# Patient Record
Sex: Female | Born: 1935 | Race: Black or African American | Hispanic: No | Smoking: Never smoker
Health system: Southern US, Community
[De-identification: ages and names within clinical notes are randomized; demographics above are authoritative.]

## PROBLEM LIST (undated history)

## (undated) DIAGNOSIS — M1990 Unspecified osteoarthritis, unspecified site: Secondary | ICD-10-CM

## (undated) DIAGNOSIS — K219 Gastro-esophageal reflux disease without esophagitis: Secondary | ICD-10-CM

## (undated) DIAGNOSIS — I10 Essential (primary) hypertension: Secondary | ICD-10-CM

## (undated) DIAGNOSIS — G4730 Sleep apnea, unspecified: Secondary | ICD-10-CM

## (undated) DIAGNOSIS — I200 Unstable angina: Secondary | ICD-10-CM

## (undated) DIAGNOSIS — G4733 Obstructive sleep apnea (adult) (pediatric): Secondary | ICD-10-CM

## (undated) DIAGNOSIS — Z9989 Dependence on other enabling machines and devices: Secondary | ICD-10-CM

## (undated) DIAGNOSIS — E119 Type 2 diabetes mellitus without complications: Secondary | ICD-10-CM

## (undated) DIAGNOSIS — R3915 Urgency of urination: Secondary | ICD-10-CM

## (undated) HISTORY — PX: ABDOMINAL HYSTERECTOMY: SHX81

## (undated) HISTORY — DX: Dependence on other enabling machines and devices: Z99.89

## (undated) HISTORY — DX: Obstructive sleep apnea (adult) (pediatric): G47.33

## (undated) HISTORY — DX: Unstable angina: I20.0

## (undated) HISTORY — PX: APPENDECTOMY: SHX54

## (undated) HISTORY — PX: EYE SURGERY: SHX253

---

## 2013-03-25 DIAGNOSIS — I10 Essential (primary) hypertension: Secondary | ICD-10-CM | POA: Insufficient documentation

## 2015-04-07 NOTE — Progress Notes (Signed)
Please put orders in Epic surgery 04-24-15 pre op 04-17-15 Thanks

## 2015-04-10 ENCOUNTER — Ambulatory Visit: Payer: Self-pay | Admitting: Orthopedic Surgery

## 2015-04-10 NOTE — Progress Notes (Signed)
Preoperative surgical orders have been place into the Epic hospital system for Tina Long on 04/10/2015, 12:51 PM  by Patrica DuelPERKINS, ALEXZANDREW for surgery on 04-24-2015.  Preop Total Knee orders including Experal, IV Tylenol, and IV Decadron as long as there are no contraindications to the above medications. [name], PA-C

## 2015-04-13 ENCOUNTER — Other Ambulatory Visit (HOSPITAL_COMMUNITY): Payer: Self-pay | Admitting: Anesthesiology

## 2015-04-13 NOTE — Progress Notes (Addendum)
02/09/2015-Pre-operative clearance from Dr. Lucienne MinksKadir on chart with last office visit and labs (Hemoglobin A1C,CMP,CBC with diff.,,U/A dip .01/29/2013-[facility]. 01/18/2013-[facility].

## 2015-04-17 ENCOUNTER — Encounter (HOSPITAL_COMMUNITY): Payer: Self-pay

## 2015-04-17 ENCOUNTER — Other Ambulatory Visit: Payer: Self-pay

## 2015-04-17 ENCOUNTER — Encounter (HOSPITAL_COMMUNITY)
Admission: RE | Admit: 2015-04-17 | Discharge: 2015-04-17 | Disposition: A | Discharge status: Home / Self Care | Payer: Medicare Other | Source: Ambulatory Visit | Attending: Orthopedic Surgery | Admitting: Orthopedic Surgery

## 2015-04-17 DIAGNOSIS — Z01812 Encounter for preprocedural laboratory examination: Secondary | ICD-10-CM | POA: Insufficient documentation

## 2015-04-17 DIAGNOSIS — M1711 Unilateral primary osteoarthritis, right knee: Secondary | ICD-10-CM | POA: Insufficient documentation

## 2015-04-17 HISTORY — DX: Unspecified osteoarthritis, unspecified site: M19.90

## 2015-04-17 HISTORY — DX: Essential (primary) hypertension: I10

## 2015-04-17 HISTORY — DX: Gastro-esophageal reflux disease without esophagitis: K21.9

## 2015-04-17 LAB — URINALYSIS, ROUTINE W REFLEX MICROSCOPIC
BILIRUBIN URINE: NEGATIVE
Glucose, UA: NEGATIVE mg/dL
KETONES UR: NEGATIVE mg/dL
Leukocytes, UA: NEGATIVE
Nitrite: NEGATIVE
Protein, ur: NEGATIVE mg/dL
SPECIFIC GRAVITY, URINE: 1.02 (ref 1.005–1.030)
UROBILINOGEN UA: 1 mg/dL (ref 0.0–1.0)
pH: 6.5 (ref 5.0–8.0)

## 2015-04-17 LAB — CBC
HEMATOCRIT: 37.6 % (ref 36.0–46.0)
HEMOGLOBIN: 11.5 g/dL — AB (ref 12.0–15.0)
MCH: 26.8 pg (ref 26.0–34.0)
MCHC: 30.6 g/dL (ref 30.0–36.0)
MCV: 87.6 fL (ref 78.0–100.0)
Platelets: 265 10*3/uL (ref 150–400)
RBC: 4.29 MIL/uL (ref 3.87–5.11)
RDW: 13.8 % (ref 11.5–15.5)
WBC: 7.7 10*3/uL (ref 4.0–10.5)

## 2015-04-17 LAB — COMPREHENSIVE METABOLIC PANEL
ALT: 15 U/L (ref 14–54)
AST: 18 U/L (ref 15–41)
Albumin: 4 g/dL (ref 3.5–5.0)
Alkaline Phosphatase: 56 U/L (ref 38–126)
Anion gap: 8 (ref 5–15)
BILIRUBIN TOTAL: 0.5 mg/dL (ref 0.3–1.2)
BUN: 30 mg/dL — ABNORMAL HIGH (ref 6–20)
CHLORIDE: 108 mmol/L (ref 101–111)
CO2: 27 mmol/L (ref 22–32)
Calcium: 9.7 mg/dL (ref 8.9–10.3)
Creatinine, Ser: 1.47 mg/dL — ABNORMAL HIGH (ref 0.44–1.00)
GFR calc Af Amer: 37 mL/min — ABNORMAL LOW (ref >60)
GFR, EST NON AFRICAN AMERICAN: 32 mL/min — AB (ref >60)
Glucose, Bld: 125 mg/dL — ABNORMAL HIGH (ref 65–99)
Potassium: 5.1 mmol/L (ref 3.5–5.1)
Sodium: 143 mmol/L (ref 135–145)
Total Protein: 7.7 g/dL (ref 6.5–8.1)

## 2015-04-17 LAB — PROTIME-INR
INR: 0.97 (ref 0.00–1.49)
Prothrombin Time: 13 seconds (ref 11.6–15.2)

## 2015-04-17 LAB — APTT: aPTT: 34 seconds (ref 24–37)

## 2015-04-17 LAB — ABO/RH: ABO/RH(D): O POS

## 2015-04-17 LAB — SURGICAL PCR SCREEN
MRSA, PCR: NEGATIVE
STAPHYLOCOCCUS AUREUS: NEGATIVE

## 2015-04-17 LAB — URINE MICROSCOPIC-ADD ON

## 2015-04-17 NOTE — Patient Instructions (Addendum)
Tina Long  04/17/2015   Your procedure is scheduled on:   Report to [facility] Main  Entrance and follow signs to               Short Stay Center at 1045 AM.  Call this number if you have problems the morning of surgery [phone]   Remember: ONLY 1 PERSON MAY GO WITH YOU TO SHORT STAY TO GET  READY MORNING OF YOUR SURGERY.   Do not eat food  :After Midnight. MAY HAVE CLEAR LIQUIDS FROM MIDNIGHT UP UNTIL 0745 AM THEN NOTHING UNTIL AFTER SURGERY!   BRING CPAP MASK AND TUBING WITH YOU MORNING OF SURGERY!   Take these medicines the morning of surgery with A SIP OF WATER: none                               You may not have any metal on your body including hair pins and              piercings  Do not wear jewelry, make-up, lotions, powders or perfumes, deodorant             Do not wear nail polish.  Do not shave  48 hours prior to surgery.              Men may shave face and neck.   Do not bring valuables to the hospital. [place] IS NOT             RESPONSIBLE   FOR VALUABLES.  Contacts, dentures or bridgework may not be worn into surgery.  Leave suitcase in the car. After surgery it may be brought to your room.     Patients discharged the day of surgery will not be allowed to drive home.  Name and phone number of your driver:  Special Instructions: N/A              Please read over the following fact sheets you were given: _____________________________________________________________________             [facility] - Preparing for Surgery Before surgery, you can play an important role.  Because skin is not sterile, your skin needs to be as free of germs as possible.  You can reduce the number of germs on your skin by washing with CHG (chlorahexidine gluconate) soap before surgery.  CHG is an antiseptic cleaner which kills germs and bonds with the skin to continue killing germs even after washing. Please DO NOT use if you have an allergy to CHG or  antibacterial soaps.  If your skin becomes reddened/irritated stop using the CHG and inform your nurse when you arrive at Short Stay. Do not shave (including legs and underarms) for at least 48 hours prior to the first CHG shower.  You may shave your face/neck. Please follow these instructions carefully:  1.  Shower with CHG Soap the night before surgery and the  morning of Surgery.  2.  If you choose to wash your hair, wash your hair first as usual with your  normal  shampoo.  3.  After you shampoo, rinse your hair and body thoroughly to remove the  shampoo.                           4.  Use CHG  as you would any other liquid soap.  You can apply chg directly  to the skin and wash                       Gently with a scrungie or clean washcloth.  5.  Apply the CHG Soap to your body ONLY FROM THE NECK DOWN.   Do not use on face/ open                           Wound or open sores. Avoid contact with eyes, ears mouth and genitals (private parts).                       Wash face,  Genitals (private parts) with your normal soap.             6.  Wash thoroughly, paying special attention to the area where your surgery  will be performed.  7.  Thoroughly rinse your body with warm water from the neck down.  8.  DO NOT shower/wash with your normal soap after using and rinsing off  the CHG Soap.                9.  Pat yourself dry with a clean towel.            10.  Wear clean pajamas.            11.  Place clean sheets on your bed the night of your first shower and do not  sleep with pets. Day of Surgery : Do not apply any lotions/deodorants the morning of surgery.  Please wear clean clothes to the hospital/surgery center.  FAILURE TO FOLLOW THESE INSTRUCTIONS MAY RESULT IN THE CANCELLATION OF YOUR SURGERY PATIENT SIGNATURE_________________________________  NURSE SIGNATURE__________________________________  ________________________________________________________________________   Rogelia MireIncentive  Spirometer  An incentive spirometer is a tool that can help keep your lungs clear and active. This tool measures how well you are filling your lungs with each breath. Taking long deep breaths may help reverse or decrease the chance of developing breathing (pulmonary) problems (especially infection) following:  A long period of time when you are unable to move or be active. BEFORE THE PROCEDURE   If the spirometer includes an indicator to show your best effort, your nurse or respiratory therapist will set it to a desired goal.  If possible, sit up straight or lean slightly forward. Try not to slouch.  Hold the incentive spirometer in an upright position. INSTRUCTIONS FOR USE   Sit on the edge of your bed if possible, or sit up as far as you can in bed or on a chair.  Hold the incentive spirometer in an upright position.  Breathe out normally.  Place the mouthpiece in your mouth and seal your lips tightly around it.  Breathe in slowly and as deeply as possible, raising the piston or the ball toward the top of the column.  Hold your breath for 3-5 seconds or for as long as possible. Allow the piston or ball to fall to the bottom of the column.  Remove the mouthpiece from your mouth and breathe out normally.  Rest for a few seconds and repeat Steps 1 through 7 at least 10 times every 1-2 hours when you are awake. Take your time and take a few normal breaths between deep breaths.  The spirometer may include an indicator to show your best effort. Use  the indicator as a goal to work toward during each repetition.  After each set of 10 deep breaths, practice coughing to be sure your lungs are clear. If you have an incision (the cut made at the time of surgery), support your incision when coughing by placing a pillow or rolled up towels firmly against it. Once you are able to get out of bed, walk around indoors and cough well. You may stop using the incentive spirometer when instructed by  your caregiver.  RISKS AND COMPLICATIONS  Take your time so you do not get dizzy or light-headed.  If you are in pain, you may need to take or ask for pain medication before doing incentive spirometry. It is harder to take a deep breath if you are having pain. AFTER USE  Rest and breathe slowly and easily.  It can be helpful to keep track of a log of your progress. Your caregiver can provide you with a simple table to help with this. If you are using the spirometer at home, follow these instructions: SEEK MEDICAL CARE IF:   You are having difficultly using the spirometer.  You have trouble using the spirometer as often as instructed.  Your pain medication is not giving enough relief while using the spirometer.  You develop fever of 100.5 F (38.1 C) or higher. SEEK IMMEDIATE MEDICAL CARE IF:   You cough up bloody sputum that had not been present before.  You develop fever of 102 F (38.9 C) or greater.  You develop worsening pain at or near the incision site. MAKE SURE YOU:   Understand these instructions.  Will watch your condition.  Will get help right away if you are not doing well or get worse. Document Released: 03/31/2007 Document Revised: 02/10/2012 Document Reviewed: 06/01/2007 ExitCare Patient Information 2014 ExitCare, [place].   ________________________________________________________________________  WHAT IS A BLOOD TRANSFUSION? Blood Transfusion Information  A transfusion is the replacement of blood or some of its parts. Blood is made up of multiple cells which provide different functions.  Red blood cells carry oxygen and are used for blood loss replacement.  White blood cells fight against infection.  Platelets control bleeding.  Plasma helps clot blood.  Other blood products are available for specialized needs, such as hemophilia or other clotting disorders. BEFORE THE TRANSFUSION  Who gives blood for transfusions?   Healthy volunteers who are  fully evaluated to make sure their blood is safe. This is blood bank blood. Transfusion therapy is the safest it has ever been in the practice of medicine. Before blood is taken from a donor, a complete history is taken to make sure that person has no history of diseases nor engages in risky social behavior (examples are intravenous drug use or sexual activity with multiple partners). The donor's travel history is screened to minimize risk of transmitting infections, such as malaria. The donated blood is tested for signs of infectious diseases, such as HIV and hepatitis. The blood is then tested to be sure it is compatible with you in order to minimize the chance of a transfusion reaction. If you or a relative donates blood, this is often done in anticipation of surgery and is not appropriate for emergency situations. It takes many days to process the donated blood. RISKS AND COMPLICATIONS Although transfusion therapy is very safe and saves many lives, the main dangers of transfusion include:   Getting an infectious disease.  Developing a transfusion reaction. This is an allergic reaction to something in the blood  you were given. Every precaution is taken to prevent this. The decision to have a blood transfusion has been considered carefully by your caregiver before blood is given. Blood is not given unless the benefits outweigh the risks. AFTER THE TRANSFUSION  Right after receiving a blood transfusion, you will usually feel much better and more energetic. This is especially true if your red blood cells have gotten low (anemic). The transfusion raises the level of the red blood cells which carry oxygen, and this usually causes an energy increase.  The nurse administering the transfusion will monitor you carefully for complications. HOME CARE INSTRUCTIONS  No special instructions are needed after a transfusion. You may find your energy is better. Speak with your caregiver about any limitations on  activity for underlying diseases you may have. SEEK MEDICAL CARE IF:   Your condition is not improving after your transfusion.  You develop redness or irritation at the intravenous (IV) site. SEEK IMMEDIATE MEDICAL CARE IF:  Any of the following symptoms occur over the next 12 hours:  Shaking chills.  You have a temperature by mouth above 102 F (38.9 C), not controlled by medicine.  Chest, back, or muscle pain.  People around you feel you are not acting correctly or are confused.  Shortness of breath or difficulty breathing.  Dizziness and fainting.  You get a rash or develop hives.  You have a decrease in urine output.  Your urine turns a dark color or changes to pink, red, or brown. Any of the following symptoms occur over the next 10 days:  You have a temperature by mouth above 102 F (38.9 C), not controlled by medicine.  Shortness of breath.  Weakness after normal activity.  The white part of the eye turns yellow (jaundice).  You have a decrease in the amount of urine or are urinating less often.  Your urine turns a dark color or changes to pink, red, or brown. Document Released: 11/15/2000 Document Revised: 02/10/2012 Document Reviewed: 07/04/2008 ExitCare Patient Information 2014 [place], [place].  _______________________________________________________________________   CLEAR LIQUID DIET   Foods Allowed                                                                     Foods Excluded  Coffee and tea, regular and decaf                             liquids that you cannot  Plain Jell-O in any flavor                                             see through such as: Fruit ices (not with fruit pulp)                                     milk, soups, orange juice  Iced Popsicles  All solid food Carbonated beverages, regular and diet                                    Cranberry, grape and apple juices Sports drinks like  Gatorade Lightly seasoned clear broth or consume(fat free) Sugar, honey syrup  Sample Menu Breakfast                                Lunch                                     Supper Cranberry juice                    Beef broth                            Chicken broth Jell-O                                     Grape juice                           Apple juice Coffee or tea                        Jell-O                                      Popsicle                                                Coffee or tea                        Coffee or tea  _____________________________________________________________________

## 2015-04-24 LAB — TYPE AND SCREEN
ABO/RH(D): O POS
ANTIBODY SCREEN: NEGATIVE

## 2015-07-12 ENCOUNTER — Ambulatory Visit: Payer: Self-pay | Admitting: Orthopedic Surgery

## 2015-07-12 NOTE — Progress Notes (Signed)
Preoperative surgical orders have been place into the Epic hospital system for Tina Long on 07/12/2015, 3:49 PM  by Patrica Duel for surgery on 07/19/2015.  Preop Total Knee orders including Experal, IV Tylenol, and IV Decadron as long as there are no contraindications to the above medications. Avel Peace, PA-C  These orders were originally entered on 04/10/2015 but due to a default 90 day rule, the orders were deleted automatically by the EPIC system requiring them to be reentered again. Avel Peace, PA-C

## 2015-07-13 NOTE — Patient Instructions (Addendum)
Tina Long  07/13/2015   Your procedure is scheduled on: 07/19/2015    Report to [facility] Main  Entrance take [place]  elevators to 3rd floor to  Short Stay Center at    1145 AM.  Call this number if you have problems the morning of surgery [phone]   Remember: ONLY 1 PERSON MAY GO WITH YOU TO SHORT STAY TO GET  READY MORNING OF YOUR SURGERY.  Do not eat food after midnite.  May have clear liquids from 12 midnite until 0700am morning of surgery then nothing by mouth.      Take these medicines the morning of surgery with A SIP OF WATER:Liquifilm tears if needed                                You may not have any metal on your body including hair pins and              piercings  Do not wear jewelry, make-up, lotions, powders or perfumes, deodorant             Do not wear nail polish.  Do not shave  48 hours prior to surgery.                Do not bring valuables to the hospital. [facility] IS NOT             RESPONSIBLE   FOR VALUABLES.  Contacts, dentures or bridgework may not be worn into surgery.  Leave suitcase in the car. After surgery it may be brought to your room.        Special Instructions:  Coughing and deep breathing exercises, leg exercises               Please read over the following fact sheets you were given: _____________________________________________________________________             [facility] - Preparing for Surgery Before surgery, you can play an important role.  Because skin is not sterile, your skin needs to be as free of germs as possible.  You can reduce the number of germs on your skin by washing with CHG (chlorahexidine gluconate) soap before surgery.  CHG is an antiseptic cleaner which kills germs and bonds with the skin to continue killing germs even after washing. Please DO NOT use if you have an allergy to CHG or antibacterial soaps.  If your skin becomes reddened/irritated stop using the CHG and inform your nurse  when you arrive at Short Stay. Do not shave (including legs and underarms) for at least 48 hours prior to the first CHG shower.  You may shave your face/neck. Please follow these instructions carefully:  1.  Shower with CHG Soap the night before surgery and the  morning of Surgery.  2.  If you choose to wash your hair, wash your hair first as usual with your  normal  shampoo.  3.  After you shampoo, rinse your hair and body thoroughly to remove the  shampoo.                           4.  Use CHG as you would any other liquid soap.  You can apply chg directly  to the skin and wash  Gently with a scrungie or clean washcloth.  5.  Apply the CHG Soap to your body ONLY FROM THE NECK DOWN.   Do not use on face/ open                           Wound or open sores. Avoid contact with eyes, ears mouth and genitals (private parts).                       Wash face,  Genitals (private parts) with your normal soap.             6.  Wash thoroughly, paying special attention to the area where your surgery  will be performed.  7.  Thoroughly rinse your body with warm water from the neck down.  8.  DO NOT shower/wash with your normal soap after using and rinsing off  the CHG Soap.                9.  Pat yourself dry with a clean towel.            10.  Wear clean pajamas.            11.  Place clean sheets on your bed the night of your first shower and do not  sleep with pets. Day of Surgery : Do not apply any lotions/deodorants the morning of surgery.  Please wear clean clothes to the hospital/surgery center.  FAILURE TO FOLLOW THESE INSTRUCTIONS MAY RESULT IN THE CANCELLATION OF YOUR SURGERY PATIENT SIGNATURE_________________________________  NURSE SIGNATURE__________________________________  ________________________________________________________________________  WHAT IS A BLOOD TRANSFUSION? Blood Transfusion Information  A transfusion is the replacement of blood or some of its  parts. Blood is made up of multiple cells which provide different functions.  Red blood cells carry oxygen and are used for blood loss replacement.  White blood cells fight against infection.  Platelets control bleeding.  Plasma helps clot blood.  Other blood products are available for specialized needs, such as hemophilia or other clotting disorders. BEFORE THE TRANSFUSION  Who gives blood for transfusions?   Healthy volunteers who are fully evaluated to make sure their blood is safe. This is blood bank blood. Transfusion therapy is the safest it has ever been in the practice of medicine. Before blood is taken from a donor, a complete history is taken to make sure that person has no history of diseases nor engages in risky social behavior (examples are intravenous drug use or sexual activity with multiple partners). The donor's travel history is screened to minimize risk of transmitting infections, such as malaria. The donated blood is tested for signs of infectious diseases, such as HIV and hepatitis. The blood is then tested to be sure it is compatible with you in order to minimize the chance of a transfusion reaction. If you or a relative donates blood, this is often done in anticipation of surgery and is not appropriate for emergency situations. It takes many days to process the donated blood. RISKS AND COMPLICATIONS Although transfusion therapy is very safe and saves many lives, the main dangers of transfusion include:  1. Getting an infectious disease. 2. Developing a transfusion reaction. This is an allergic reaction to something in the blood you were given. Every precaution is taken to prevent this. The decision to have a blood transfusion has been considered carefully by your caregiver before blood is given. Blood is not given unless the benefits outweigh  the risks. AFTER THE TRANSFUSION  Right after receiving a blood transfusion, you will usually feel much better and more energetic.  This is especially true if your red blood cells have gotten low (anemic). The transfusion raises the level of the red blood cells which carry oxygen, and this usually causes an energy increase.  The nurse administering the transfusion will monitor you carefully for complications. HOME CARE INSTRUCTIONS  No special instructions are needed after a transfusion. You may find your energy is better. Speak with your caregiver about any limitations on activity for underlying diseases you may have. SEEK MEDICAL CARE IF:   Your condition is not improving after your transfusion.  You develop redness or irritation at the intravenous (IV) site. SEEK IMMEDIATE MEDICAL CARE IF:  Any of the following symptoms occur over the next 12 hours:  Shaking chills.  You have a temperature by mouth above 102 F (38.9 C), not controlled by medicine.  Chest, back, or muscle pain.  People around you feel you are not acting correctly or are confused.  Shortness of breath or difficulty breathing.  Dizziness and fainting.  You get a rash or develop hives.  You have a decrease in urine output.  Your urine turns a dark color or changes to pink, red, or brown. Any of the following symptoms occur over the next 10 days:  You have a temperature by mouth above 102 F (38.9 C), not controlled by medicine.  Shortness of breath.  Weakness after normal activity.  The white part of the eye turns yellow (jaundice).  You have a decrease in the amount of urine or are urinating less often.  Your urine turns a dark color or changes to pink, red, or brown. Document Released: 11/15/2000 Document Revised: 02/10/2012 Document Reviewed: 07/04/2008 ExitCare Patient Information 2014 [place].  _______________________________________________________________________  Incentive Spirometer  An incentive spirometer is a tool that can help keep your lungs clear and active. This tool measures how well you are filling  your lungs with each breath. Taking long deep breaths may help reverse or decrease the chance of developing breathing (pulmonary) problems (especially infection) following:  A long period of time when you are unable to move or be active. BEFORE THE PROCEDURE   If the spirometer includes an indicator to show your best effort, your nurse or respiratory therapist will set it to a desired goal.  If possible, sit up straight or lean slightly forward. Try not to slouch.  Hold the incentive spirometer in an upright position. INSTRUCTIONS FOR USE  3. Sit on the edge of your bed if possible, or sit up as far as you can in bed or on a chair. 4. Hold the incentive spirometer in an upright position. 5. Breathe out normally. 6. Place the mouthpiece in your mouth and seal your lips tightly around it. 7. Breathe in slowly and as deeply as possible, raising the piston or the ball toward the top of the column. 8. Hold your breath for 3-5 seconds or for as long as possible. Allow the piston or ball to fall to the bottom of the column. 9. Remove the mouthpiece from your mouth and breathe out normally. 10. Rest for a few seconds and repeat Steps 1 through 7 at least 10 times every 1-2 hours when you are awake. Take your time and take a few normal breaths between deep breaths. 11. The spirometer may include an indicator to show your best effort. Use the indicator as a goal to work  toward during each repetition. 12. After each set of 10 deep breaths, practice coughing to be sure your lungs are clear. If you have an incision (the cut made at the time of surgery), support your incision when coughing by placing a pillow or rolled up towels firmly against it. Once you are able to get out of bed, walk around indoors and cough well. You may stop using the incentive spirometer when instructed by your caregiver.  RISKS AND COMPLICATIONS  Take your time so you do not get dizzy or light-headed.  If you are in pain, you  may need to take or ask for pain medication before doing incentive spirometry. It is harder to take a deep breath if you are having pain. AFTER USE  Rest and breathe slowly and easily.  It can be helpful to keep track of a log of your progress. Your caregiver can provide you with a simple table to help with this. If you are using the spirometer at home, follow these instructions: SEEK MEDICAL CARE IF:   You are having difficultly using the spirometer.  You have trouble using the spirometer as often as instructed.  Your pain medication is not giving enough relief while using the spirometer.  You develop fever of 100.5 F (38.1 C) or higher. SEEK IMMEDIATE MEDICAL CARE IF:   You cough up bloody sputum that had not been present before.  You develop fever of 102 F (38.9 C) or greater.  You develop worsening pain at or near the incision site. MAKE SURE YOU:   Understand these instructions.  Will watch your condition.  Will get help right away if you are not doing well or get worse. Document Released: 03/31/2007 Document Revised: 02/10/2012 Document Reviewed: 06/01/2007 ExitCare Patient Information 2014 ExitCare, [place].   ________________________________________________________________________    CLEAR LIQUID DIET   Foods Allowed                                                                     Foods Excluded  Coffee and tea, regular and decaf                             liquids that you cannot  Plain Jell-O in any flavor                                             see through such as: Fruit ices (not with fruit pulp)                                     milk, soups, orange juice  Iced Popsicles                                    All solid food Carbonated beverages, regular and diet  Cranberry, grape and apple juices Sports drinks like Gatorade Lightly seasoned clear broth or consume(fat free) Sugar, honey syrup  Sample  Menu Breakfast                                Lunch                                     Supper Cranberry juice                    Beef broth                            Chicken broth Jell-O                                     Grape juice                           Apple juice Coffee or tea                        Jell-O                                      Popsicle                                                Coffee or tea                        Coffee or tea  _____________________________________________________________________

## 2015-07-17 ENCOUNTER — Encounter (HOSPITAL_COMMUNITY)
Admission: RE | Admit: 2015-07-17 | Discharge: 2015-07-17 | Disposition: A | Discharge status: Home / Self Care | Payer: Medicare Other | Source: Ambulatory Visit | Attending: Orthopedic Surgery | Admitting: Orthopedic Surgery

## 2015-07-17 ENCOUNTER — Encounter (HOSPITAL_COMMUNITY): Payer: Self-pay

## 2015-07-17 HISTORY — DX: Urgency of urination: R39.15

## 2015-07-17 HISTORY — DX: Sleep apnea, unspecified: G47.30

## 2015-07-17 HISTORY — DX: Type 2 diabetes mellitus without complications: E11.9

## 2015-07-17 LAB — PROTIME-INR
INR: 0.93 (ref 0.00–1.49)
PROTHROMBIN TIME: 12.7 s (ref 11.6–15.2)

## 2015-07-17 LAB — CBC
HEMATOCRIT: 39 % (ref 36.0–46.0)
Hemoglobin: 12.5 g/dL (ref 12.0–15.0)
MCH: 27.8 pg (ref 26.0–34.0)
MCHC: 32.1 g/dL (ref 30.0–36.0)
MCV: 86.9 fL (ref 78.0–100.0)
PLATELETS: 288 10*3/uL (ref 150–400)
RBC: 4.49 MIL/uL (ref 3.87–5.11)
RDW: 14.1 % (ref 11.5–15.5)
WBC: 8.6 10*3/uL (ref 4.0–10.5)

## 2015-07-17 LAB — URINE MICROSCOPIC-ADD ON

## 2015-07-17 LAB — URINALYSIS, ROUTINE W REFLEX MICROSCOPIC
BILIRUBIN URINE: NEGATIVE
Glucose, UA: NEGATIVE mg/dL
KETONES UR: NEGATIVE mg/dL
LEUKOCYTES UA: NEGATIVE
NITRITE: NEGATIVE
PH: 6.5 (ref 5.0–8.0)
PROTEIN: NEGATIVE mg/dL
Specific Gravity, Urine: 1.007 (ref 1.005–1.030)
UROBILINOGEN UA: 1 mg/dL (ref 0.0–1.0)

## 2015-07-17 LAB — COMPREHENSIVE METABOLIC PANEL
ALBUMIN: 4 g/dL (ref 3.5–5.0)
ALT: 11 U/L — AB (ref 14–54)
AST: 18 U/L (ref 15–41)
Alkaline Phosphatase: 64 U/L (ref 38–126)
Anion gap: 7 (ref 5–15)
BILIRUBIN TOTAL: 0.6 mg/dL (ref 0.3–1.2)
BUN: 23 mg/dL — AB (ref 6–20)
CHLORIDE: 101 mmol/L (ref 101–111)
CO2: 28 mmol/L (ref 22–32)
CREATININE: 1.02 mg/dL — AB (ref 0.44–1.00)
Calcium: 9.5 mg/dL (ref 8.9–10.3)
GFR calc Af Amer: 56 mL/min — ABNORMAL LOW (ref >60)
GFR, EST NON AFRICAN AMERICAN: 49 mL/min — AB (ref >60)
GLUCOSE: 108 mg/dL — AB (ref 65–99)
POTASSIUM: 3.6 mmol/L (ref 3.5–5.1)
Sodium: 136 mmol/L (ref 135–145)
Total Protein: 7.4 g/dL (ref 6.5–8.1)

## 2015-07-17 LAB — SURGICAL PCR SCREEN
MRSA, PCR: NEGATIVE
STAPHYLOCOCCUS AUREUS: NEGATIVE

## 2015-07-17 LAB — APTT: APTT: 34 s (ref 24–37)

## 2015-07-17 NOTE — Progress Notes (Addendum)
CMP, U/A and micro  results done 07/17/2015 faxed via EPIC to Dr Lequita Halt

## 2015-07-17 NOTE — Progress Notes (Signed)
EKG- 04/2015 - in EPIC and on chart  Stress 2014 on chart LOV- Dr Lucienne Minks 02/09/2015 and 06/09/15 on chart

## 2015-07-18 ENCOUNTER — Ambulatory Visit: Payer: Self-pay | Admitting: Orthopedic Surgery

## 2015-07-18 MED ORDER — CHLORHEXIDINE GLUCONATE 4 % EX LIQD: 60.0000 mL | Freq: Once | CUTANEOUS | Status: DC

## 2015-07-18 MED ORDER — SODIUM CHLORIDE 0.9 % IV SOLN: INTRAVENOUS | Status: DC

## 2015-07-18 MED ORDER — CEFAZOLIN SODIUM-DEXTROSE 2-3 GM-% IV SOLR: 2.0000 g | INTRAVENOUS | Status: DC

## 2015-07-18 MED ORDER — BUPIVACAINE LIPOSOME 1.3 % IJ SUSP
20.0000 mL | Freq: Once | INTRAMUSCULAR | Status: DC
  Filled 2015-07-18: qty 20

## 2015-07-18 MED ORDER — ACETAMINOPHEN 10 MG/ML IV SOLN
1000.0000 mg | Freq: Once | INTRAVENOUS | Status: DC
  Filled 2015-07-18: qty 100

## 2015-07-18 MED ORDER — DEXAMETHASONE SODIUM PHOSPHATE 10 MG/ML IJ SOLN: 10.0000 mg | Freq: Once | INTRAMUSCULAR | Status: DC

## 2015-07-18 NOTE — H&P (Signed)
Tina Long DOB: [birth year] Divorced / Language: Long / Race: Black or African American Female Date of Admission:  07/19/2015 CC:  Right knee pain History of Present Illness The patient is a 81 year old female who comes in today for a preoperative History and Physical. The patient is scheduled for a right total knee arthroplasty to be performed by Dr. Gus Rankin. Aluisio, MD at [facility] on 07/19/2015. The patient is a 81 year old female who presents with knee complaints. The patient was seen for a second opinion. The patient reports right knee symptoms including: pain, instability, giving way, weakness, stiffness and grinding which began year(s) ago without any known injury.The patient feels that the symptoms are worsening. The patient has the current diagnosis of knee osteoarthritis. Prior to being seen, the patient was previously evaluated by a colleague. Past treatment for this problem has included intra-articular injection of corticosteroids (also had repeat series of Synvisc injections in both knees completed in November 2015. She reports she got good relief in both knees with the first Synvisc series, but no relief in the right knee after the second series) and nonsteroidal anti-inflammatory drugs. Current treatment includes oral corticosteroids (currently taking prednisone). She said that the right knee is bothering her at all time. It has been getting progressively worse over time. She has been treated in [place] at an orthopaedic clinic in [place] with injections. She was told that she was at a stage where she had to consider knee replacement. Her family lives here. She is here today for evaluation and consideration of surgical treatment. She said the right knee hurts far more than the left. The right knee limits when she can and cannot do. She is at a stage where it is hurting her all the time including at night. She is not having any associated hip pain, lower extremity  weakness, or paresthesia with this. She was orginally scheduled back in April but due to some scheduling conflicts, she was rescheduled until 07/19/2015. They have been treated conservatively in the past for the above stated problem and despite conservative measures, they continue to have progressive pain and severe functional limitations and dysfunction. They have failed non-operative management including home exercise, medications, and injections. It is felt that they would benefit from undergoing total joint replacement. Risks and benefits of the procedure have been discussed with the patient and they elect to proceed with surgery. There are no active contraindications to surgery such as ongoing infection or rapidly progressive neurological disease.  Problem List/Past Medical  Primary osteoarthritis of right knee (M17.11) Bronchitis Past History Cataract Varicose veins Sleep Apnea uses CPAP High blood pressure Gastroesophageal Reflux Disease Has taken Nexium in the past Vertigo Menopause Urinary Tract Infection Recent - Treated In April 2016 Diverticulosis Mumps Measles  Allergies  Morphine Sulfate *ANALGESICS - OPIOID* Itching. TraMADol HCl *ANALGESICS - OPIOID* Itching.  Family History Hypertension Father, Mother. Diabetes Mellitus Mother.  Social History Post-Surgical Plans [facility] Not under pain contract Living situation live alone Tobacco / smoke exposure 12/09/2014: no No history of drug/alcohol rehab Never consumed alcohol 12/09/2014: Never consumed alcohol Exercise Exercises weekly; does other Marital status divorced Current work status retired Children 3 Tobacco use Never smoker. 12/09/2014  Medication History  PredniSONE (  Tablet, Oral as needed) Active. Losartan Potassium-HCTZ (100-25MG  Tablet, Oral) Active. Vitamin D (Oral) Specific dose unknown - Active. Vitamin B Complex (Oral) Active. Multivitamin (Oral)  Active.  Past Surgical History Appendectomy Date: 36. Cataract Surgery bilateral Hysterectomy partial (non-cancerous) Foot  Surgery left  Review of Systems (Alexzandrew L. Perkins (Aluisio) III PA-C; 07/18/2015 3:21 PM) General Not Present- Chills, Fatigue, Fever, Memory Loss, Night Sweats, Weight Gain and Weight Loss. Skin Not Present- Eczema, Hives, Itching, Lesions and Rash. HEENT Not Present- Dentures, Double Vision, Headache, Hearing Loss, Tinnitus and Visual Loss. Respiratory Not Present- Allergies, Chronic Cough, Coughing up blood, Shortness of breath at rest and Shortness of breath with exertion. Cardiovascular Not Present- Chest Pain, Difficulty Breathing Lying Down, Murmur, Palpitations, Racing/skipping heartbeats and Swelling. Gastrointestinal Not Present- Abdominal Pain, Bloody Stool, Constipation, Diarrhea, Difficulty Swallowing, Heartburn, Jaundice, Loss of appetitie, Nausea and Vomiting. Female Genitourinary Present- Urinating at Night. Not Present- Blood in Urine, Discharge, Flank Pain, Incontinence, Painful Urination, Urgency, Urinary frequency, Urinary Retention and Weak urinary stream. Musculoskeletal Present- Back Pain, Joint Pain, Joint Swelling and Morning Stiffness. Not Present- Muscle Pain, Muscle Weakness and Spasms. Neurological Not Present- Blackout spells, Difficulty with balance, Dizziness, Paralysis, Tremor and Weakness. Psychiatric Not Present- Insomnia.  Vitals  Weight: 218 lb Height: 65in Weight was reported by patient. Height was reported by patient. Body Surface Area: 2.05 m Body Mass Index: 36.28 kg/m  BP: 142/68 (Sitting, Right Arm, Standard)  Physical Exam (Alexzandrew L. Perkins (Aluisio) III PA-C; 07/18/2015 3:21 PM) General Mental Status -Alert, cooperative and good historian. General Appearance-pleasant, Not in acute distress. Orientation-Oriented X3. Build & Nutrition-Well nourished and Well developed.  Head and  Neck Head-normocephalic, atraumatic . Neck Global Assessment - supple, no bruit auscultated on the right, no bruit auscultated on the left.  Eye Vision-Wears corrective lenses. Pupil - Bilateral-Regular and Round. Motion - Bilateral-EOMI.  ENMT Note: upper denture and lower partial denture   Chest and Lung Exam Auscultation Breath sounds - clear at anterior chest wall and clear at posterior chest wall. Adventitious sounds - No Adventitious sounds.  Cardiovascular Auscultation Rhythm - Regular rate and rhythm. Heart Sounds - S1 WNL and S2 WNL. Murmurs & Other Heart Sounds - Auscultation of the heart reveals - No Murmurs.  Abdomen Palpation/Percussion Tenderness - Abdomen is non-tender to palpation. Rigidity (guarding) - Abdomen is soft. Auscultation Auscultation of the abdomen reveals - Bowel sounds normal.  Female Genitourinary Note: Not done, not pertinent to present illness   Musculoskeletal Note: On exam, she is alert and oriented and in no apparent distress. Evaluation of her hips show normal range of motion with no discomfort. Evaluation of her left knee shows no effusion. There is slight crepitus on range of motion of the left knee. Her range of motion is about 5 to 130. There is no tenderness and no instability. The right knee has no effusion and ranges 10 to 120 with marked crepitus on ranges of motion, tenderness medial greater than lateral, and no instability noted. Pulses, sensation, and motor are intact.  RADIOGRAPHS: AP of both knees and lateral show that she has significant bone on bone arthritis in the medial and patellofemoral compartments of the right knee. Her left knee has some arthritic changes, but not as bad as the right.   Assessment & Plan Primary osteoarthritis of right knee (M17.11) Note:Right Total Knee Replacement  Disposition: [facility]  PCP: Dr. Lucienne Minks - Patient has been seen preoperatively and felt to be stable for  surgery.  IV TXA  Anesthesia Issues: None  Signed electronically by Lauraine Rinne, III PA-C

## 2015-07-19 ENCOUNTER — Encounter (HOSPITAL_COMMUNITY)
Admission: AD | Disposition: A | Discharge status: Skilled Nursing Facility | Payer: Self-pay | Source: Ambulatory Visit | Attending: Orthopedic Surgery

## 2015-07-19 ENCOUNTER — Inpatient Hospital Stay (HOSPITAL_COMMUNITY): Payer: Medicare Other | Admitting: Anesthesiology

## 2015-07-19 ENCOUNTER — Inpatient Hospital Stay (HOSPITAL_COMMUNITY)
Admission: AD | Admit: 2015-07-19 | Discharge: 2015-07-21 | DRG: 470 | Disposition: A | Discharge status: Skilled Nursing Facility | Payer: Medicare Other | Source: Ambulatory Visit | Attending: Orthopedic Surgery | Admitting: Orthopedic Surgery

## 2015-07-19 ENCOUNTER — Encounter (HOSPITAL_COMMUNITY): Payer: Self-pay | Admitting: *Deleted

## 2015-07-19 DIAGNOSIS — I8390 Asymptomatic varicose veins of unspecified lower extremity: Secondary | ICD-10-CM | POA: Diagnosis present

## 2015-07-19 DIAGNOSIS — I10 Essential (primary) hypertension: Secondary | ICD-10-CM | POA: Diagnosis present

## 2015-07-19 DIAGNOSIS — G4730 Sleep apnea, unspecified: Secondary | ICD-10-CM | POA: Diagnosis present

## 2015-07-19 DIAGNOSIS — M179 Osteoarthritis of knee, unspecified: Secondary | ICD-10-CM | POA: Diagnosis present

## 2015-07-19 DIAGNOSIS — K219 Gastro-esophageal reflux disease without esophagitis: Secondary | ICD-10-CM | POA: Diagnosis present

## 2015-07-19 DIAGNOSIS — M1711 Unilateral primary osteoarthritis, right knee: Secondary | ICD-10-CM | POA: Diagnosis present

## 2015-07-19 DIAGNOSIS — Z7952 Long term (current) use of systemic steroids: Secondary | ICD-10-CM

## 2015-07-19 DIAGNOSIS — E119 Type 2 diabetes mellitus without complications: Secondary | ICD-10-CM | POA: Diagnosis present

## 2015-07-19 DIAGNOSIS — Z885 Allergy status to narcotic agent status: Secondary | ICD-10-CM | POA: Diagnosis not present

## 2015-07-19 DIAGNOSIS — M1710 Unilateral primary osteoarthritis, unspecified knee: Secondary | ICD-10-CM | POA: Diagnosis present

## 2015-07-19 HISTORY — PX: TOTAL KNEE ARTHROPLASTY: SHX125

## 2015-07-19 LAB — TYPE AND SCREEN
ABO/RH(D): O POS
ANTIBODY SCREEN: NEGATIVE

## 2015-07-19 LAB — GLUCOSE, CAPILLARY: Glucose-Capillary: 93 mg/dL (ref 65–99)

## 2015-07-19 SURGERY — ARTHROPLASTY, KNEE, TOTAL
Anesthesia: General | Laterality: Right

## 2015-07-19 MED ORDER — PROPOFOL 10 MG/ML IV BOLUS
INTRAVENOUS | Status: AC
  Filled 2015-07-19: qty 20

## 2015-07-19 MED ORDER — FLEET ENEMA 7-19 GM/118ML RE ENEM: 1.0000 | ENEMA | Freq: Once | RECTAL | Status: DC | PRN

## 2015-07-19 MED ORDER — SODIUM CHLORIDE 0.9 % IV SOLN: INTRAVENOUS | Status: DC

## 2015-07-19 MED ORDER — BUPIVACAINE HCL (PF) 0.25 % IJ SOLN
INTRAMUSCULAR | Status: AC
  Filled 2015-07-19: qty 30

## 2015-07-19 MED ORDER — CEFAZOLIN SODIUM-DEXTROSE 2-3 GM-% IV SOLR
2.0000 g | INTRAVENOUS | Status: AC
  Administered 2015-07-19: 2 g via INTRAVENOUS

## 2015-07-19 MED ORDER — ACETAMINOPHEN 10 MG/ML IV SOLN
1000.0000 mg | Freq: Once | INTRAVENOUS | Status: AC
  Administered 2015-07-19: 1000 mg via INTRAVENOUS
  Filled 2015-07-19: qty 100

## 2015-07-19 MED ORDER — TRANEXAMIC ACID 1000 MG/10ML IV SOLN
1000.0000 mg | INTRAVENOUS | Status: AC
  Administered 2015-07-19: 1000 mg via INTRAVENOUS
  Filled 2015-07-19: qty 10

## 2015-07-19 MED ORDER — ONDANSETRON HCL 4 MG/2ML IJ SOLN: 4.0000 mg | Freq: Four times a day (QID) | INTRAMUSCULAR | Status: DC | PRN

## 2015-07-19 MED ORDER — HYDROMORPHONE HCL 1 MG/ML IJ SOLN
0.5000 mg | INTRAMUSCULAR | Status: DC | PRN
  Administered 2015-07-19: 1 mg via INTRAVENOUS
  Administered 2015-07-19: 0.5 mg via INTRAVENOUS
  Administered 2015-07-20 (×3): 1 mg via INTRAVENOUS
  Filled 2015-07-19 (×5): qty 1

## 2015-07-19 MED ORDER — METOCLOPRAMIDE HCL 5 MG/ML IJ SOLN: 5.0000 mg | Freq: Three times a day (TID) | INTRAMUSCULAR | Status: DC | PRN

## 2015-07-19 MED ORDER — DOCUSATE SODIUM 100 MG PO CAPS
100.0000 mg | ORAL_CAPSULE | Freq: Two times a day (BID) | ORAL | Status: DC
  Administered 2015-07-19 – 2015-07-21 (×4): 100 mg via ORAL

## 2015-07-19 MED ORDER — BUPIVACAINE LIPOSOME 1.3 % IJ SUSP
20.0000 mL | Freq: Once | INTRAMUSCULAR | Status: DC
  Filled 2015-07-19: qty 20

## 2015-07-19 MED ORDER — ONDANSETRON HCL 4 MG/2ML IJ SOLN
INTRAMUSCULAR | Status: DC | PRN
  Administered 2015-07-19: 4 mg via INTRAVENOUS

## 2015-07-19 MED ORDER — HYDROCHLOROTHIAZIDE 25 MG PO TABS
25.0000 mg | ORAL_TABLET | Freq: Every day | ORAL | Status: DC
  Administered 2015-07-20 – 2015-07-21 (×2): 25 mg via ORAL
  Filled 2015-07-19 (×2): qty 1

## 2015-07-19 MED ORDER — ACETAMINOPHEN 325 MG PO TABS
650.0000 mg | ORAL_TABLET | Freq: Four times a day (QID) | ORAL | Status: DC | PRN
Start: 2015-07-20 — End: 2015-07-21

## 2015-07-19 MED ORDER — LOSARTAN POTASSIUM 50 MG PO TABS
100.0000 mg | ORAL_TABLET | Freq: Every day | ORAL | Status: DC
  Administered 2015-07-20 – 2015-07-21 (×2): 100 mg via ORAL
  Filled 2015-07-19 (×2): qty 2

## 2015-07-19 MED ORDER — BUPIVACAINE LIPOSOME 1.3 % IJ SUSP
INTRAMUSCULAR | Status: DC | PRN
  Administered 2015-07-19: 20 mL

## 2015-07-19 MED ORDER — POLYETHYLENE GLYCOL 3350 17 G PO PACK: 17.0000 g | PACK | Freq: Every day | ORAL | Status: DC | PRN

## 2015-07-19 MED ORDER — LACTATED RINGERS IV SOLN
INTRAVENOUS | Status: DC
  Administered 2015-07-19 (×2): via INTRAVENOUS

## 2015-07-19 MED ORDER — BUPIVACAINE HCL 0.25 % IJ SOLN
INTRAMUSCULAR | Status: DC | PRN
  Administered 2015-07-19: 20 mL

## 2015-07-19 MED ORDER — ACETAMINOPHEN 650 MG RE SUPP: 650.0000 mg | Freq: Four times a day (QID) | RECTAL | Status: DC | PRN

## 2015-07-19 MED ORDER — DEXAMETHASONE SODIUM PHOSPHATE 10 MG/ML IJ SOLN
INTRAMUSCULAR | Status: AC
  Filled 2015-07-19: qty 1

## 2015-07-19 MED ORDER — CEFAZOLIN SODIUM-DEXTROSE 2-3 GM-% IV SOLR
INTRAVENOUS | Status: AC
  Filled 2015-07-19: qty 50

## 2015-07-19 MED ORDER — MENTHOL 3 MG MT LOZG: 1.0000 | LOZENGE | OROMUCOSAL | Status: DC | PRN

## 2015-07-19 MED ORDER — PROPOFOL INFUSION 10 MG/ML OPTIME
INTRAVENOUS | Status: DC | PRN
  Administered 2015-07-19: 80 ug/kg/min via INTRAVENOUS

## 2015-07-19 MED ORDER — DEXAMETHASONE SODIUM PHOSPHATE 10 MG/ML IJ SOLN
10.0000 mg | Freq: Once | INTRAMUSCULAR | Status: AC
  Administered 2015-07-19: 10 mg via INTRAVENOUS

## 2015-07-19 MED ORDER — METHOCARBAMOL 500 MG PO TABS
500.0000 mg | ORAL_TABLET | Freq: Four times a day (QID) | ORAL | Status: DC | PRN
  Administered 2015-07-20 (×2): 500 mg via ORAL
  Filled 2015-07-19 (×2): qty 1

## 2015-07-19 MED ORDER — METOCLOPRAMIDE HCL 10 MG PO TABS: 5.0000 mg | ORAL_TABLET | Freq: Three times a day (TID) | ORAL | Status: DC | PRN

## 2015-07-19 MED ORDER — FENTANYL CITRATE (PF) 100 MCG/2ML IJ SOLN
INTRAMUSCULAR | Status: AC
Start: 2015-07-19 — End: 2015-07-19
  Filled 2015-07-19: qty 4

## 2015-07-19 MED ORDER — CHLORHEXIDINE GLUCONATE 4 % EX LIQD: 60.0000 mL | Freq: Once | CUTANEOUS | Status: DC

## 2015-07-19 MED ORDER — SODIUM CHLORIDE 0.9 % IJ SOLN
INTRAMUSCULAR | Status: DC | PRN
  Administered 2015-07-19: 20 mL via INTRAVENOUS

## 2015-07-19 MED ORDER — FENTANYL CITRATE (PF) 100 MCG/2ML IJ SOLN: 25.0000 ug | INTRAMUSCULAR | Status: DC | PRN

## 2015-07-19 MED ORDER — SODIUM CHLORIDE 0.9 % IJ SOLN
INTRAMUSCULAR | Status: AC
  Filled 2015-07-19: qty 50

## 2015-07-19 MED ORDER — PROPOFOL 10 MG/ML IV BOLUS
INTRAVENOUS | Status: DC | PRN
  Administered 2015-07-19 (×2): 20 mg via INTRAVENOUS

## 2015-07-19 MED ORDER — SODIUM CHLORIDE 0.9 % IR SOLN
Status: DC | PRN
  Administered 2015-07-19: 3000 mL

## 2015-07-19 MED ORDER — RIVAROXABAN 10 MG PO TABS
10.0000 mg | ORAL_TABLET | Freq: Every day | ORAL | Status: DC
  Administered 2015-07-20 – 2015-07-21 (×2): 10 mg via ORAL
  Filled 2015-07-19 (×3): qty 1

## 2015-07-19 MED ORDER — EPHEDRINE SULFATE 50 MG/ML IJ SOLN
INTRAMUSCULAR | Status: DC | PRN
  Administered 2015-07-19 (×3): 10 mg via INTRAVENOUS

## 2015-07-19 MED ORDER — LOSARTAN POTASSIUM-HCTZ 100-25 MG PO TABS: 1.0000 | ORAL_TABLET | Freq: Every morning | ORAL | Status: DC

## 2015-07-19 MED ORDER — ONDANSETRON HCL 4 MG/2ML IJ SOLN
INTRAMUSCULAR | Status: AC
  Filled 2015-07-19: qty 2

## 2015-07-19 MED ORDER — METHOCARBAMOL 1000 MG/10ML IJ SOLN
500.0000 mg | Freq: Four times a day (QID) | INTRAVENOUS | Status: DC | PRN
  Administered 2015-07-19: 500 mg via INTRAVENOUS
  Filled 2015-07-19 (×2): qty 5

## 2015-07-19 MED ORDER — DEXAMETHASONE SODIUM PHOSPHATE 10 MG/ML IJ SOLN
10.0000 mg | Freq: Once | INTRAMUSCULAR | Status: AC
  Administered 2015-07-20: 10 mg via INTRAVENOUS
  Filled 2015-07-19: qty 1

## 2015-07-19 MED ORDER — BISACODYL 10 MG RE SUPP: 10.0000 mg | Freq: Every day | RECTAL | Status: DC | PRN

## 2015-07-19 MED ORDER — POTASSIUM CHLORIDE IN NACL 20-0.9 MEQ/L-% IV SOLN
INTRAVENOUS | Status: DC
Start: 2015-07-19 — End: 2015-07-21
  Administered 2015-07-19 – 2015-07-20 (×2): via INTRAVENOUS
  Filled 2015-07-19 (×6): qty 1000

## 2015-07-19 MED ORDER — ACETAMINOPHEN 500 MG PO TABS
1000.0000 mg | ORAL_TABLET | Freq: Four times a day (QID) | ORAL | Status: AC
  Administered 2015-07-19 – 2015-07-20 (×4): 1000 mg via ORAL
  Filled 2015-07-19 (×5): qty 2

## 2015-07-19 MED ORDER — PROMETHAZINE HCL 25 MG/ML IJ SOLN: 6.2500 mg | INTRAMUSCULAR | Status: DC | PRN

## 2015-07-19 MED ORDER — CEFAZOLIN SODIUM-DEXTROSE 2-3 GM-% IV SOLR
2.0000 g | Freq: Four times a day (QID) | INTRAVENOUS | Status: AC
  Administered 2015-07-19 – 2015-07-20 (×2): 2 g via INTRAVENOUS
  Filled 2015-07-19 (×2): qty 50

## 2015-07-19 MED ORDER — ONDANSETRON HCL 4 MG PO TABS: 4.0000 mg | ORAL_TABLET | Freq: Four times a day (QID) | ORAL | Status: DC | PRN

## 2015-07-19 MED ORDER — BUPIVACAINE IN DEXTROSE 0.75-8.25 % IT SOLN
INTRATHECAL | Status: DC | PRN
  Administered 2015-07-19: 1.6 mL via INTRATHECAL

## 2015-07-19 MED ORDER — PHENOL 1.4 % MT LIQD: 1.0000 | OROMUCOSAL | Status: DC | PRN

## 2015-07-19 MED ORDER — DIPHENHYDRAMINE HCL 12.5 MG/5ML PO ELIX
12.5000 mg | ORAL_SOLUTION | ORAL | Status: DC | PRN
  Administered 2015-07-20: 25 mg via ORAL
  Administered 2015-07-20: 12.5 mg via ORAL
  Administered 2015-07-20 – 2015-07-21 (×4): 25 mg via ORAL
  Filled 2015-07-19 (×2): qty 10
  Filled 2015-07-19: qty 5
  Filled 2015-07-19 (×3): qty 10

## 2015-07-19 SURGICAL SUPPLY — 62 items
BAG DECANTER FOR FLEXI CONT (MISCELLANEOUS) ×3 IMPLANT
BAG ZIPLOCK 12X15 (MISCELLANEOUS) ×3 IMPLANT
BANDAGE ELASTIC 6 VELCRO ST LF (GAUZE/BANDAGES/DRESSINGS) ×3 IMPLANT
BANDAGE ESMARK 6X9 LF (GAUZE/BANDAGES/DRESSINGS) ×1 IMPLANT
BLADE SAG 18X100X1.27 (BLADE) ×3 IMPLANT
BLADE SAW SGTL 11.0X1.19X90.0M (BLADE) ×3 IMPLANT
BNDG ESMARK 6X9 LF (GAUZE/BANDAGES/DRESSINGS) ×3
BOWL SMART MIX CTS (DISPOSABLE) ×3 IMPLANT
CAP KNEE TOTAL 3 SIGMA ×3 IMPLANT
CEMENT HV SMART SET (Cement) ×6 IMPLANT
CLOSURE WOUND 1/2 X4 (GAUZE/BANDAGES/DRESSINGS) ×1
CUFF TOURN SGL QUICK 34 (TOURNIQUET CUFF) ×2
CUFF TRNQT CYL 34X4X40X1 (TOURNIQUET CUFF) ×1 IMPLANT
DECANTER SPIKE VIAL GLASS SM (MISCELLANEOUS) ×3 IMPLANT
DRAPE EXTREMITY T 121X128X90 (DRAPE) ×3 IMPLANT
DRAPE POUCH INSTRU U-SHP 10X18 (DRAPES) ×3 IMPLANT
DRAPE U-SHAPE 47X51 STRL (DRAPES) ×3 IMPLANT
DRSG ADAPTIC 3X8 NADH LF (GAUZE/BANDAGES/DRESSINGS) ×3 IMPLANT
DRSG PAD ABDOMINAL 8X10 ST (GAUZE/BANDAGES/DRESSINGS) ×3 IMPLANT
DURAPREP 26ML APPLICATOR (WOUND CARE) ×3 IMPLANT
ELECT REM PT RETURN 9FT ADLT (ELECTROSURGICAL) ×3
ELECTRODE REM PT RTRN 9FT ADLT (ELECTROSURGICAL) ×1 IMPLANT
EVACUATOR 1/8 PVC DRAIN (DRAIN) ×3 IMPLANT
FACESHIELD WRAPAROUND (MASK) ×15 IMPLANT
GAUZE SPONGE 4X4 12PLY STRL (GAUZE/BANDAGES/DRESSINGS) ×3 IMPLANT
GLOVE BIO SURGEON STRL SZ7.5 (GLOVE) IMPLANT
GLOVE BIO SURGEON STRL SZ8 (GLOVE) ×3 IMPLANT
GLOVE BIOGEL PI IND STRL 6.5 (GLOVE) IMPLANT
GLOVE BIOGEL PI IND STRL 8 (GLOVE) ×1 IMPLANT
GLOVE BIOGEL PI INDICATOR 6.5 (GLOVE)
GLOVE BIOGEL PI INDICATOR 8 (GLOVE) ×2
GLOVE SURG SS PI 6.5 STRL IVOR (GLOVE) IMPLANT
GOWN STRL REUS W/TWL LRG LVL3 (GOWN DISPOSABLE) ×3 IMPLANT
GOWN STRL REUS W/TWL XL LVL3 (GOWN DISPOSABLE) IMPLANT
HANDPIECE INTERPULSE COAX TIP (DISPOSABLE) ×2
IMMOBILIZER KNEE 20 (SOFTGOODS) ×3
IMMOBILIZER KNEE 20 THIGH 36 (SOFTGOODS) ×1 IMPLANT
KIT BASIN OR (CUSTOM PROCEDURE TRAY) ×3 IMPLANT
MANIFOLD NEPTUNE II (INSTRUMENTS) ×3 IMPLANT
NDL SAFETY ECLIPSE 18X1.5 (NEEDLE) ×2 IMPLANT
NEEDLE HYPO 18GX1.5 SHARP (NEEDLE) ×4
NS IRRIG 1000ML POUR BTL (IV SOLUTION) ×3 IMPLANT
PACK TOTAL JOINT (CUSTOM PROCEDURE TRAY) ×3 IMPLANT
PADDING CAST COTTON 6X4 STRL (CAST SUPPLIES) ×3 IMPLANT
PEN SKIN MARKING BROAD (MISCELLANEOUS) ×3 IMPLANT
POSITIONER SURGICAL ARM (MISCELLANEOUS) ×3 IMPLANT
SET HNDPC FAN SPRY TIP SCT (DISPOSABLE) ×1 IMPLANT
STRIP CLOSURE SKIN 1/2X4 (GAUZE/BANDAGES/DRESSINGS) ×2 IMPLANT
SUCTION FRAZIER 12FR DISP (SUCTIONS) ×3 IMPLANT
SUT MNCRL AB 4-0 PS2 18 (SUTURE) ×3 IMPLANT
SUT VIC AB 2-0 CT1 27 (SUTURE) ×6
SUT VIC AB 2-0 CT1 TAPERPNT 27 (SUTURE) ×3 IMPLANT
SUT VLOC 180 0 24IN GS25 (SUTURE) ×3 IMPLANT
SYR 20CC LL (SYRINGE) ×3 IMPLANT
SYR 50ML LL SCALE MARK (SYRINGE) ×3 IMPLANT
TOWEL OR 17X26 10 PK STRL BLUE (TOWEL DISPOSABLE) ×3 IMPLANT
TOWEL OR NON WOVEN STRL DISP B (DISPOSABLE) IMPLANT
TRAY FOLEY W/METER SILVER 14FR (SET/KITS/TRAYS/PACK) ×3 IMPLANT
TRAY FOLEY W/METER SILVER 16FR (SET/KITS/TRAYS/PACK) ×3 IMPLANT
WATER STERILE IRR 1500ML POUR (IV SOLUTION) ×3 IMPLANT
WRAP KNEE MAXI GEL POST OP (GAUZE/BANDAGES/DRESSINGS) ×3 IMPLANT
YANKAUER SUCT BULB TIP 10FT TU (MISCELLANEOUS) ×3 IMPLANT

## 2015-07-19 NOTE — Progress Notes (Signed)
Utilization review completed.  

## 2015-07-19 NOTE — Op Note (Signed)
Pre-operative diagnosis- Osteoarthritis  Right knee(s)  Post-operative diagnosis- Osteoarthritis Right knee(s)  Procedure-  Right  Total Knee Arthroplasty  Surgeon- Tina Long. Tina Elrod, MD  Assistant- Tina Able, PA-C   Anesthesia-  Spinal  EBL-* No blood loss amount entered *   Drains Hemovac  Tourniquet time-  Total Tourniquet Time Documented: Thigh (Right) - 37 minutes Total: Thigh (Right) - 37 minutes     Complications- None  Condition-PACU - hemodynamically stable.   Brief Clinical Note  Tina Long is a 81 y.o. year old female with end stage OA of her right knee with progressively worsening pain and dysfunction. She has constant pain, with activity and at rest and significant functional deficits with difficulties even with ADLs. She has had extensive non-op management including analgesics, injections of cortisone and viscosupplements, and home exercise program, but remains in significant pain with significant dysfunction.Radiographs show bone on bone arthritis medial and patellofemoral. She presents now for right Total Knee Arthroplasty.    Procedure in detail---   The patient is brought into the operating room and positioned supine on the operating table. After successful administration of  Spinal,   a tourniquet is placed high on the  Right thigh(s) and the lower extremity is prepped and draped in the usual sterile fashion. Time out is performed by the operating team and then the  Right lower extremity is wrapped in Esmarch, knee flexed and the tourniquet inflated to 300 mmHg.       A midline incision is made with a ten blade through the subcutaneous tissue to the level of the extensor mechanism. A fresh blade is used to make a medial parapatellar arthrotomy. Soft tissue over the proximal medial tibia is subperiosteally elevated to the joint line with a knife and into the semimembranosus bursa with a Cobb elevator. Soft tissue over the proximal lateral tibia is elevated with  attention being paid to avoiding the patellar tendon on the tibial tubercle. The patella is everted, knee flexed 90 degrees and the ACL and PCL are removed. Findings are bone on bone medial and patellofemoral with large global osteophytes.        The drill is used to create a starting hole in the distal femur and the canal is thoroughly irrigated with sterile saline to remove the fatty contents. The 5 degree Right  valgus alignment guide is placed into the femoral canal and the distal femoral cutting block is pinned to remove 10 mm off the distal femur. Resection is made with an oscillating saw.      The tibia is subluxed forward and the menisci are removed. The extramedullary alignment guide is placed referencing proximally at the medial aspect of the tibial tubercle and distally along the second metatarsal axis and tibial crest. The block is pinned to remove 2mm off the more deficient medial  side. Resection is made with an oscillating saw. Size 3is the most appropriate size for the tibia and the proximal tibia is prepared with the modular drill and keel punch for that size.      The femoral sizing guide is placed and size 3 is most appropriate. Rotation is marked off the epicondylar axis and confirmed by creating a rectangular flexion gap at 90 degrees. The size 3 cutting block is pinned in this rotation and the anterior, posterior and chamfer cuts are made with the oscillating saw. The intercondylar block is then placed and that cut is made.      Trial size 3 tibial component, trial  size 3 posterior stabilized femur and a 12.5  mm posterior stabilized rotating platform insert trial is placed. Full extension is achieved with excellent varus/valgus and anterior/posterior balance throughout full range of motion. The patella is everted and thickness measured to be 24  mm. Free hand resection is taken to 14 mm, a 38 template is placed, lug holes are drilled, trial patella is placed, and it tracks normally.  Osteophytes are removed off the posterior femur with the trial in place. All trials are removed and the cut bone surfaces prepared with pulsatile lavage. Cement is mixed and once ready for implantation, the size 3 tibial implant, size  3 posterior stabilized femoral component, and the size 38 patella are cemented in place and the patella is held with the clamp. The trial insert is placed and the knee held in full extension. The Exparel (20 ml mixed with 30 ml saline) and .25% Bupivicaine, are injected into the extensor mechanism, posterior capsule, medial and lateral gutters and subcutaneous tissues.  All extruded cement is removed and once the cement is hard the permanent 12.5 mm posterior stabilized rotating platform insert is placed into the tibial tray.      The wound is copiously irrigated with saline solution and the extensor mechanism closed over a hemovac drain with #1 V-loc suture. The tourniquet is released for a total tourniquet time of 37  minutes. Flexion against gravity is 135 degrees and the patella tracks normally. Subcutaneous tissue is closed with 2.0 vicryl and subcuticular with running 4.0 Monocryl. The incision is cleaned and dried and steri-strips and a bulky sterile dressing are applied. The limb is placed into a knee immobilizer and the patient is awakened and transported to recovery in stable condition.      Please note that a surgical assistant was a medical necessity for this procedure in order to perform it in a safe and expeditious manner. Surgical assistant was necessary to retract the ligaments and vital neurovascular structures to prevent injury to them and also necessary for proper positioning of the limb to allow for anatomic placement of the prosthesis.   Tina Long Tina Shevlin, MD    07/19/2015, 2:37 PM

## 2015-07-19 NOTE — Anesthesia Preprocedure Evaluation (Signed)
Anesthesia Evaluation  Patient identified by MRN, date of birth, ID band Patient awake    Reviewed: Allergy & Precautions, NPO status , Patient's Chart, lab work & pertinent test results  Airway Mallampati: II  TM Distance: >3 FB Neck ROM: Full    Dental no notable dental hx.    Pulmonary sleep apnea and Continuous Positive Airway Pressure Ventilation ,  breath sounds clear to auscultation  Pulmonary exam normal       Cardiovascular hypertension, Normal cardiovascular examRhythm:Regular Rate:Normal     Neuro/Psych negative neurological ROS  negative psych ROS   GI/Hepatic Neg liver ROS, GERD-  ,  Endo/Other  diabetesobesity  Renal/GU negative Renal ROS  negative genitourinary   Musculoskeletal negative musculoskeletal ROS (+)   Abdominal   Peds negative pediatric ROS (+)  Hematology negative hematology ROS (+)   Anesthesia Other Findings   Reproductive/Obstetrics negative OB ROS                             Anesthesia Physical Anesthesia Plan  ASA: III  Anesthesia Plan: General   Post-op Pain Management:    Induction: Intravenous  Airway Management Planned: LMA  Additional Equipment:   Intra-op Plan:   Post-operative Plan: Extubation in OR  Informed Consent: I have reviewed the patients History and Physical, chart, labs and discussed the procedure including the risks, benefits and alternatives for the proposed anesthesia with the patient or authorized representative who has indicated his/her understanding and acceptance.   Dental advisory given  Plan Discussed with: CRNA and Surgeon  Anesthesia Plan Comments:         Anesthesia Quick Evaluation

## 2015-07-19 NOTE — Progress Notes (Signed)
Pt stated that she will self administer CPAP when ready for bed.  Current settings are Auto CPAP 5-20 CMH20 via Pt home FFM and tubing.  Pt to call if any assistance is required throughout the night.  RT to monitor and assess as needed.

## 2015-07-19 NOTE — Anesthesia Postprocedure Evaluation (Signed)
  Anesthesia Post-op Note  Patient: Tina Long  Procedure(s) Performed: Procedure(s) (LRB): RIGHT TOTAL KNEE ARTHROPLASTY (Right)  Patient Location: PACU  Anesthesia Type: Spinal  Level of Consciousness: awake and alert   Airway and Oxygen Therapy: Patient Spontanous Breathing  Post-op Pain: mild  Post-op Assessment: Post-op Vital signs reviewed, Patient's Cardiovascular Status Stable, Respiratory Function Stable, Patent Airway and No signs of Nausea or vomiting  Last Vitals:  Filed Vitals:   07/19/15 1504  BP: 139/53  Pulse: 90  Temp: 36.4 C  Resp: 22    Post-op Vital Signs: stable   Complications: No apparent anesthesia complications

## 2015-07-19 NOTE — Transfer of Care (Signed)
Immediate Anesthesia Transfer of Care Note  Patient: Tina Long  Procedure(s) Performed: Procedure(s): RIGHT TOTAL KNEE ARTHROPLASTY (Right)  Patient Location: PACU  Anesthesia Type:MAC and Spinal  Level of Consciousness: awake, alert , oriented and patient cooperative  Airway & Oxygen Therapy: Patient Spontanous Breathing and Patient connected to face mask oxygen  Post-op Assessment: Report given to RN and Post -op Vital signs reviewed and stable  Post vital signs: Reviewed and stable  Last Vitals:  Filed Vitals:   07/19/15 1159  BP: 142/69  Pulse: 88  Temp: 36.4 C  Resp: 16    Complications: No apparent anesthesia complications

## 2015-07-19 NOTE — Interval H&P Note (Signed)
History and Physical Interval Note:  07/19/2015 1:14 PM  Tina Long  has presented today for surgery, with the diagnosis of OA RIGHT KNEE  The various methods of treatment have been discussed with the patient and family. After consideration of risks, benefits and other options for treatment, the patient has consented to  Procedure(s): RIGHT TOTAL KNEE ARTHROPLASTY (Right) as a surgical intervention .  The patient's history has been reviewed, patient examined, no change in status, stable for surgery.  I have reviewed the patient's chart and labs.  Questions were answered to the patient's satisfaction.     Loanne Drilling

## 2015-07-19 NOTE — H&P (View-Only) (Signed)
Tina Long DOB: [birth year] Divorced / Language: Long / Race: Black or African American Female Date of Admission:  07/19/2015 CC:  Right knee pain History of Present Illness The patient is a 81 year old female who comes in today for a preoperative History and Physical. The patient is scheduled for a right total knee arthroplasty to be performed by Dr. Gus Rankin. Aluisio, MD at [facility] on 07/19/2015. The patient is a 81 year old female who presents with knee complaints. The patient was seen for a second opinion. The patient reports right knee symptoms including: pain, instability, giving way, weakness, stiffness and grinding which began year(s) ago without any known injury.The patient feels that the symptoms are worsening. The patient has the current diagnosis of knee osteoarthritis. Prior to being seen, the patient was previously evaluated by a colleague. Past treatment for this problem has included intra-articular injection of corticosteroids (also had repeat series of Synvisc injections in both knees completed in November 2015. She reports she got good relief in both knees with the first Synvisc series, but no relief in the right knee after the second series) and nonsteroidal anti-inflammatory drugs. Current treatment includes oral corticosteroids (currently taking prednisone). She said that the right knee is bothering her at all time. It has been getting progressively worse over time. She has been treated in [place] at an orthopaedic clinic in [place] with injections. She was told that she was at a stage where she had to consider knee replacement. Her family lives here. She is here today for evaluation and consideration of surgical treatment. She said the right knee hurts far more than the left. The right knee limits when she can and cannot do. She is at a stage where it is hurting her all the time including at night. She is not having any associated hip pain, lower extremity  weakness, or paresthesia with this. She was orginally scheduled back in April but due to some scheduling conflicts, she was rescheduled until 07/19/2015. They have been treated conservatively in the past for the above stated problem and despite conservative measures, they continue to have progressive pain and severe functional limitations and dysfunction. They have failed non-operative management including home exercise, medications, and injections. It is felt that they would benefit from undergoing total joint replacement. Risks and benefits of the procedure have been discussed with the patient and they elect to proceed with surgery. There are no active contraindications to surgery such as ongoing infection or rapidly progressive neurological disease.  Problem List/Past Medical  Primary osteoarthritis of right knee (M17.11) Bronchitis Past History Cataract Varicose veins Sleep Apnea uses CPAP High blood pressure Gastroesophageal Reflux Disease Has taken Nexium in the past Vertigo Menopause Urinary Tract Infection Recent - Treated In April 2016 Diverticulosis Mumps Measles  Allergies  Morphine Sulfate *ANALGESICS - OPIOID* Itching. TraMADol HCl *ANALGESICS - OPIOID* Itching.  Family History Hypertension Father, Mother. Diabetes Mellitus Mother.  Social History Post-Surgical Plans [facility] Not under pain contract Living situation live alone Tobacco / smoke exposure 12/09/2014: no No history of drug/alcohol rehab Never consumed alcohol 12/09/2014: Never consumed alcohol Exercise Exercises weekly; does other Marital status divorced Current work status retired Children 3 Tobacco use Never smoker. 12/09/2014  Medication History  PredniSONE (  Tablet, Oral as needed) Active. Losartan Potassium-HCTZ (100-25MG  Tablet, Oral) Active. Vitamin D (Oral) Specific dose unknown - Active. Vitamin B Complex (Oral) Active. Multivitamin (Oral)  Active.  Past Surgical History Appendectomy Date: 34. Cataract Surgery bilateral Hysterectomy partial (non-cancerous) Foot  Surgery left  Review of Systems ([name] (Long) III PA-C; 07/18/2015 3:21 PM) General Not Present- Chills, Fatigue, Fever, Memory Loss, Night Sweats, Weight Gain and Weight Loss. Skin Not Present- Eczema, Hives, Itching, Lesions and Rash. HEENT Not Present- Dentures, Double Vision, Headache, Hearing Loss, Tinnitus and Visual Loss. Respiratory Not Present- Allergies, Chronic Cough, Coughing up blood, Shortness of breath at rest and Shortness of breath with exertion. Cardiovascular Not Present- Chest Pain, Difficulty Breathing Lying Down, Murmur, Palpitations, Racing/skipping heartbeats and Swelling. Gastrointestinal Not Present- Abdominal Pain, Bloody Stool, Constipation, Diarrhea, Difficulty Swallowing, Heartburn, Jaundice, Loss of appetitie, Nausea and Vomiting. Female Genitourinary Present- Urinating at Night. Not Present- Blood in Urine, Discharge, Flank Pain, Incontinence, Painful Urination, Urgency, Urinary frequency, Urinary Retention and Weak urinary stream. Musculoskeletal Present- Back Pain, Joint Pain, Joint Swelling and Morning Stiffness. Not Present- Muscle Pain, Muscle Weakness and Spasms. Neurological Not Present- Blackout spells, Difficulty with balance, Dizziness, Paralysis, Tremor and Weakness. Psychiatric Not Present- Insomnia.  Vitals  Weight: 218 lb Height: 65in Weight was reported by patient. Height was reported by patient. Body Surface Area: 2.05 m Body Mass Index: 36.28 kg/m  BP: 142/68 (Sitting, Right Arm, Standard)  Physical Exam (Gitty Osterlund L. Ernesto Zukowski (Long) III PA-C; 07/18/2015 3:21 PM) General Mental Status -Alert, cooperative and good historian. General Appearance-pleasant, Not in acute distress. Orientation-Oriented X3. Build & Nutrition-Well nourished and Well developed.  Head and  Neck Head-normocephalic, atraumatic . Neck Global Assessment - supple, no bruit auscultated on the right, no bruit auscultated on the left.  Eye Vision-Wears corrective lenses. Pupil - Bilateral-Regular and Round. Motion - Bilateral-EOMI.  ENMT Note: upper denture and lower partial denture   Chest and Lung Exam Auscultation Breath sounds - clear at anterior chest wall and clear at posterior chest wall. Adventitious sounds - No Adventitious sounds.  Cardiovascular Auscultation Rhythm - Regular rate and rhythm. Heart Sounds - S1 WNL and S2 WNL. Murmurs & Other Heart Sounds - Auscultation of the heart reveals - No Murmurs.  Abdomen Palpation/Percussion Tenderness - Abdomen is non-tender to palpation. Rigidity (guarding) - Abdomen is soft. Auscultation Auscultation of the abdomen reveals - Bowel sounds normal.  Female Genitourinary Note: Not done, not pertinent to present illness   Musculoskeletal Note: On exam, she is alert and oriented and in no apparent distress. Evaluation of her hips show normal range of motion with no discomfort. Evaluation of her left knee shows no effusion. There is slight crepitus on range of motion of the left knee. Her range of motion is about 5 to 130. There is no tenderness and no instability. The right knee has no effusion and ranges 10 to 120 with marked crepitus on ranges of motion, tenderness medial greater than lateral, and no instability noted. Pulses, sensation, and motor are intact.  RADIOGRAPHS: AP of both knees and lateral show that she has significant bone on bone arthritis in the medial and patellofemoral compartments of the right knee. Her left knee has some arthritic changes, but not as bad as the right.   Assessment & Plan Primary osteoarthritis of right knee (M17.11) Note:Right Total Knee Replacement  Disposition: [facility]  PCP: Dr. Lucienne Minks - Patient has been seen preoperatively and felt to be stable for  surgery.  IV TXA  Anesthesia Issues: None  Signed electronically by Lauraine Rinne, III PA-C

## 2015-07-20 ENCOUNTER — Encounter (HOSPITAL_COMMUNITY): Payer: Self-pay | Admitting: Orthopedic Surgery

## 2015-07-20 LAB — BASIC METABOLIC PANEL
Anion gap: 9 (ref 5–15)
BUN: 17 mg/dL (ref 6–20)
CO2: 24 mmol/L (ref 22–32)
Calcium: 9 mg/dL (ref 8.9–10.3)
Chloride: 102 mmol/L (ref 101–111)
Creatinine, Ser: 1.01 mg/dL — ABNORMAL HIGH (ref 0.44–1.00)
GFR calc Af Amer: 57 mL/min — ABNORMAL LOW (ref >60)
GFR, EST NON AFRICAN AMERICAN: 49 mL/min — AB (ref >60)
Glucose, Bld: 185 mg/dL — ABNORMAL HIGH (ref 65–99)
POTASSIUM: 4.2 mmol/L (ref 3.5–5.1)
SODIUM: 135 mmol/L (ref 135–145)

## 2015-07-20 LAB — CBC
HCT: 35.5 % — ABNORMAL LOW (ref 36.0–46.0)
Hemoglobin: 11.1 g/dL — ABNORMAL LOW (ref 12.0–15.0)
MCH: 26.5 pg (ref 26.0–34.0)
MCHC: 31.3 g/dL (ref 30.0–36.0)
MCV: 84.7 fL (ref 78.0–100.0)
PLATELETS: 266 10*3/uL (ref 150–400)
RBC: 4.19 MIL/uL (ref 3.87–5.11)
RDW: 13.7 % (ref 11.5–15.5)
WBC: 10.2 10*3/uL (ref 4.0–10.5)

## 2015-07-20 MED ORDER — HYDROCODONE-ACETAMINOPHEN 5-325 MG PO TABS: 1.0000 | ORAL_TABLET | ORAL | Status: DC | PRN

## 2015-07-20 MED ORDER — RIVAROXABAN 10 MG PO TABS: 10.0000 mg | ORAL_TABLET | Freq: Every day | ORAL | Status: DC

## 2015-07-20 MED ORDER — OXYCODONE HCL 5 MG PO TABS: 5.0000 mg | ORAL_TABLET | ORAL | Status: DC | PRN

## 2015-07-20 MED ORDER — OXYCODONE HCL 5 MG PO TABS
5.0000 mg | ORAL_TABLET | ORAL | Status: DC | PRN
  Administered 2015-07-20: 5 mg via ORAL
  Administered 2015-07-20 – 2015-07-21 (×6): 10 mg via ORAL
  Filled 2015-07-20: qty 1
  Filled 2015-07-20 (×2): qty 2
  Filled 2015-07-20 (×2): qty 1
  Filled 2015-07-20 (×3): qty 2

## 2015-07-20 MED ORDER — DOCUSATE SODIUM 100 MG PO CAPS: 100.0000 mg | ORAL_CAPSULE | Freq: Two times a day (BID) | ORAL | Status: DC

## 2015-07-20 MED ORDER — METHOCARBAMOL 500 MG PO TABS: 500.0000 mg | ORAL_TABLET | Freq: Four times a day (QID) | ORAL | Status: DC | PRN

## 2015-07-20 NOTE — Evaluation (Deleted)
Physical Therapy Evaluation Patient Details Name: Tina Long MRN: [id number] DOB: [birth year] Today's Date: 07/20/2015   History of Present Illness  81 yo female adm  07/19/15 for R TKA; PMHx: HTN, OSA, DM  Clinical Impression  Pt admitted with above diagnosis. Pt currently with functional limitations due to the deficits listed below (see PT Problem List).  Pt will benefit from skilled PT to increase their independence and safety with mobility to allow discharge to the venue listed below.  Pt will need SNF post acute     Follow Up Recommendations SNF    Equipment Recommendations  Rolling walker with 5" wheels    Recommendations for Other Services       Precautions / Restrictions Precautions Precautions: Knee Required Braces or Orthoses: Knee Immobilizer - Right Knee Immobilizer - Right: Discontinue once straight leg raise with < 10 degree lag Restrictions Weight Bearing Restrictions: Yes Other Position/Activity Restrictions: WBAT      Mobility  Bed Mobility Overal bed mobility: Needs Assistance Bed Mobility: Supine to Sit     Supine to sit: Min assist     General bed mobility comments: cues for technique adn assist with RLE  Transfers Overall transfer level: Needs assistance Equipment used: Rolling walker (2 wheeled) Transfers: Sit to/from Stand Sit to Stand: Min assist Stand pivot transfers: Min assist       General transfer comment: Min A to power up into standing and min A to steady during transfer   Ambulation/Gait Ambulation/Gait assistance: Min assist Ambulation Distance (Feet): 25 Feet Assistive device: Rolling walker (2 wheeled) Gait Pattern/deviations: Step-to pattern     General Gait Details: cues for sequence, RW position from self  Stairs            Wheelchair Mobility    Modified Rankin (Stroke Patients Only)       Balance Overall balance assessment: Needs assistance Sitting-balance support: Feet supported Sitting  balance-Leahy Scale: Fair     Standing balance support: Bilateral upper extremity supported Standing balance-Leahy Scale: Poor                               Pertinent Vitals/Pain Pain Assessment: 0-10 Pain Score: 9  Pain Location: Rt knee Pain Descriptors / Indicators: Aching;Constant;Sharp;Grimacing Pain Intervention(s): Monitored during session;Repositioned;Ice applied;Patient requesting pain meds-RN notified    Home Living Family/patient expects to be discharged to:: Skilled nursing facility Living Arrangements: Alone               Additional Comments: planning fo rehab at [facility]    Prior Function Level of Independence: Independent               Hand Dominance        Extremity/Trunk Assessment   Upper Extremity Assessment: Overall WFL for tasks assessed           Lower Extremity Assessment: Defer to PT evaluation RLE Deficits / Details: hip flexion and hip extension grossly 2+/5; ankle AROM WFL       Communication   Communication: No difficulties  Cognition Arousal/Alertness: Awake/alert Behavior During Therapy: WFL for tasks assessed/performed Overall Cognitive Status: Within Functional Limits for tasks assessed                      General Comments      Exercises Total Joint Exercises Ankle Circles/Pumps: AROM;Both;10 reps Quad Sets: 10 reps;Both;AROM Heel Slides: AROM;Both;10 reps Straight Leg Raises: AROM;Both;10 reps  Assessment/Plan    PT Assessment Patient needs continued PT services  PT Diagnosis Difficulty walking   PT Problem List Decreased strength;Decreased range of motion;Decreased activity tolerance;Decreased mobility;Decreased knowledge of use of DME  PT Treatment Interventions DME instruction;Gait training;Functional mobility training;Therapeutic activities;Patient/family education;Therapeutic exercise   PT Goals (Current goals can be found in the Care Plan section) Acute Rehab PT  Goals Patient Stated Goal: to regain independence  PT Goal Formulation: With patient Time For Goal Achievement: 07/27/15 Potential to Achieve Goals: Good    Frequency 7X/week   Barriers to discharge        Co-evaluation               End of Session Equipment Utilized During Treatment: Gait belt Activity Tolerance: Patient tolerated treatment well Patient left: in bed;with call bell/phone within reach;with family/visitor present           Time: 1610-9604 PT Time Calculation (min) (ACUTE ONLY): 30 min   Charges:   PT Evaluation $Initial PT Evaluation Tier I: 1 Procedure PT Treatments $Gait Training: 8-22 mins   PT G Codes:        Ahliya Glatt 08-03-15, 1:44 PM

## 2015-07-20 NOTE — Care Management Note (Signed)
Case Management Note  Patient Details  Name: CHRISHAWN KRING MRN: [id number] Date of Birth: 16-Jun-1928  Subjective/Objective:                   RIGHT TOTAL KNEE ARTHROPLASTY (Right) Action/Plan: Discharge planning Expected Discharge Date:                  Expected Discharge Plan:  Skilled Nursing Facility  In-House Referral:     Discharge planning Services  CM Consult  Post Acute Care Choice:    Choice offered to:     DME Arranged:    DME Agency:     HH Arranged:    HH Agency:     Status of Service:  Completed, signed off  Medicare Important Message Given:    Date Medicare IM Given:    Medicare IM give by:    Date Additional Medicare IM Given:    Additional Medicare Important Message give by:     If discussed at Long Length of Stay Meetings, dates discussed:    Additional Comments: CM notes pt to go to SNF for rehab; CSW arranging.  No other CM needs were communicated. Yves Dill, RN 07/20/2015, 1:28 PM

## 2015-07-20 NOTE — Discharge Instructions (Addendum)
INSTRUCTIONS AFTER JOINT REPLACEMENT  ° °o Remove items at home which could result in a fall. This includes throw rugs or furniture in walking pathways °o ICE to the affected joint every three hours while awake for 30 minutes at a time, for at least the first 3-5 days, and then as needed for pain and swelling.  Continue to use ice for pain and swelling. You may notice swelling that will progress down to the foot and ankle.  This is normal after surgery.  Elevate your leg when you are not up walking on it.   °o Continue to use the breathing machine you got in the hospital (incentive spirometer) which will help keep your temperature down.  It is common for your temperature to cycle up and down following surgery, especially at night when you are not up moving around and exerting yourself.  The breathing machine keeps your lungs expanded and your temperature down. ° ° °DIET:  As you were doing prior to hospitalization, we recommend a well-balanced diet. ° °DRESSING / WOUND CARE / SHOWERING ° °You may change your dressing 3-5 days after surgery.  Then change the dressing every day with sterile gauze.  Please use good hand washing techniques before changing the dressing.  Do not use any lotions or creams on the incision until instructed by your surgeon. ° °ACTIVITY ° °o Increase activity slowly as tolerated, but follow the weight bearing instructions below.   °o No driving for 6 weeks or until further direction given by your physician.  You cannot drive while taking narcotics.  °o No lifting or carrying greater than 10 lbs. until further directed by your surgeon. °o Avoid periods of inactivity such as sitting longer than an hour when not asleep. This helps prevent blood clots.  °o You may return to work once you are authorized by your doctor.  ° ° ° °WEIGHT BEARING  ° °Weight bearing as tolerated with assist device (walker, cane, etc) as directed, use it as long as suggested by your surgeon or therapist, typically at  least 4-6 weeks. ° ° °EXERCISES ° °Results after joint replacement surgery are often greatly improved when you follow the exercise, range of motion and muscle strengthening exercises prescribed by your doctor. Safety measures are also important to protect the joint from further injury. Any time any of these exercises cause you to have increased pain or swelling, decrease what you are doing until you are comfortable again and then slowly increase them. If you have problems or questions, call your caregiver or physical therapist for advice.  ° °Rehabilitation is important following a joint replacement. After just a few days of immobilization, the muscles of the leg can become weakened and shrink (atrophy).  These exercises are designed to build up the tone and strength of the thigh and leg muscles and to improve motion. Often times heat used for twenty to thirty minutes before working out will loosen up your tissues and help with improving the range of motion but do not use heat for the first two weeks following surgery (sometimes heat can increase post-operative swelling).  ° °These exercises can be done on a training (exercise) mat, on the floor, on a table or on a bed. Use whatever works the best and is most comfortable for you.    Use music or television while you are exercising so that the exercises are a pleasant break in your day. This will make your life better with the exercises acting as a break   in your routine that you can look forward to.   Perform all exercises about fifteen times, three times per day or as directed.  You should exercise both the operative leg and the other leg as well. ° °Exercises include: °  °• Quad Sets - Tighten up the muscle on the front of the thigh (Quad) and hold for 5-10 seconds.   °• Straight Leg Raises - With your knee straight (if you were given a brace, keep it on), lift the leg to 60 degrees, hold for 3 seconds, and slowly lower the leg.  Perform this exercise against  resistance later as your leg gets stronger.  °• Leg Slides: Lying on your back, slowly slide your foot toward your buttocks, bending your knee up off the floor (only go as far as is comfortable). Then slowly slide your foot back down until your leg is flat on the floor again.  °• Angel Wings: Lying on your back spread your legs to the side as far apart as you can without causing discomfort.  °• Hamstring Strength:  Lying on your back, push your heel against the floor with your leg straight by tightening up the muscles of your buttocks.  Repeat, but this time bend your knee to a comfortable angle, and push your heel against the floor.  You may put a pillow under the heel to make it more comfortable if necessary.  ° °A rehabilitation program following joint replacement surgery can speed recovery and prevent re-injury in the future due to weakened muscles. Contact your doctor or a physical therapist for more information on knee rehabilitation.  ° ° °CONSTIPATION ° °Constipation is defined medically as fewer than three stools per week and severe constipation as less than one stool per week.  Even if you have a regular bowel pattern at home, your normal regimen is likely to be disrupted due to multiple reasons following surgery.  Combination of anesthesia, postoperative narcotics, change in appetite and fluid intake all can affect your bowels.  ° °YOU MUST use at least one of the following options; they are listed in order of increasing strength to get the job done.  They are all available over the counter, and you may need to use some, POSSIBLY even all of these options:   ° °Drink plenty of fluids (prune juice may be helpful) and high fiber foods °Colace 100 mg by mouth twice a day  °Senokot for constipation as directed and as needed Dulcolax (bisacodyl), take with full glass of water  °Miralax (polyethylene glycol) once or twice a day as needed. ° °If you have tried all these things and are unable to have a bowel  movement in the first 3-4 days after surgery call either your surgeon or your primary doctor.   ° °If you experience loose stools or diarrhea, hold the medications until you stool forms back up.  If your symptoms do not get better within 1 week or if they get worse, check with your doctor.  If you experience "the worst abdominal pain ever" or develop nausea or vomiting, please contact the office immediately for further recommendations for treatment. ° ° °ITCHING:  If you experience itching with your medications, try taking only a single pain pill, or even half a pain pill at a time.  You can also use Benadryl over the counter for itching or also to help with sleep.  ° °TED HOSE STOCKINGS:  Use stockings on both legs until for at least 2 weeks or as   directed by physician office. They may be removed at night for sleeping.  MEDICATIONS:  See your medication summary on the After Visit Summary that nursing will review with you.  You may have some home medications which will be placed on hold until you complete the course of blood thinner medication.  It is important for you to complete the blood thinner medication as prescribed.  PRECAUTIONS:  If you experience chest pain or shortness of breath - call 911 immediately for transfer to the hospital emergency department.   If you develop a fever greater that 101 F, purulent drainage from wound, increased redness or drainage from wound, foul odor from the wound/dressing, or calf pain - CONTACT YOUR SURGEON.                                                   FOLLOW-UP APPOINTMENTS:  If you do not already have a post-op appointment, please call the office for an appointment to be seen by your surgeon.  Guidelines for how soon to be seen are listed in your After Visit Summary, but are typically between 1-4 weeks after surgery.   MAKE SURE YOU:   Understand these instructions.   Get help right away if you are not doing well or get worse.    Thank you for  letting us be a part of your medical care team.  It is a privilege we respect greatly.  We hope these instructions will help you stay on track for a fast and full recovery!   Information on my medicine - XARELTO (Rivaroxaban)  This medication education was reviewed with me or my healthcare representative as part of my discharge preparation.  The pharmacist that spoke with me during my hospital stay was:  Dannielle Huh, [facility]  Why was Xarelto prescribed for you? Xarelto was prescribed for you to reduce the risk of blood clots forming after orthopedic surgery. The medical term for these abnormal blood clots is venous thromboembolism (VTE).  What do you need to know about xarelto ? Take your Xarelto ONCE DAILY at the same time every day. You may take it either with or without food.  If you have difficulty swallowing the tablet whole, you may crush it and mix in applesauce just prior to taking your dose.  Take Xarelto exactly as prescribed by your doctor and DO NOT stop taking Xarelto without talking to the doctor who prescribed the medication.  Stopping without other VTE prevention medication to take the place of Xarelto may increase your risk of developing a clot.  After discharge, you should have regular check-up appointments with your healthcare provider that is prescribing your Xarelto.    What do you do if you miss a dose? If you miss a dose, take it as soon as you remember on the same day then continue your regularly scheduled once daily regimen the next day. Do not take two doses of Xarelto on the same day.   Important Safety Information A possible side effect of Xarelto is bleeding. You should call your healthcare provider right away if you experience any of the following: ? Bleeding from an injury or your nose that does not stop. ? Unusual colored urine (red or dark brown) or unusual colored stools (red or black). ? Unusual bruising for unknown reasons. ? A serious  fall or if  you hit your head (even if there is no bleeding). ° °Some medicines may interact with Xarelto® and might increase your risk of bleeding while on Xarelto®. To help avoid this, consult your healthcare provider or pharmacist prior to using any new prescription or non-prescription medications, including herbals, vitamins, non-steroidal anti-inflammatory drugs (NSAIDs) and supplements. ° °This website has more information on Xarelto®: www.xarelto.com. ° ° ° ° °

## 2015-07-20 NOTE — Clinical Social Work Note (Signed)
Clinical Social Work Assessment  Patient Details  Name: Tina Long MRN: [id number] Date of Birth: 01/16/1928  Date of referral:  07/20/15               Reason for consult:  Discharge Planning, Facility Placement                Permission sought to share information with:  Facility Contact Representative Permission granted to share information::  Yes, Verbal Permission Granted  Name::        Agency::     Relationship::     Contact Information:     Housing/Transportation Living arrangements for the past 2 months:  Single Family Home Source of Information:  Patient Patient Interpreter Needed:  None Criminal Activity/Legal Involvement Pertinent to Current Situation/Hospitalization:  No - Comment as needed Significant Relationships:  Adult Children Lives with:  Self Do you feel safe going back to the place where you live?   (ST Rehab is needed.) Need for family participation in patient care:  Yes (Comment)  Care giving concerns: Pt's care cannot be managed at home following hospital d/c.    Social Worker assessment / plan:  Pt hospitalized on 07/19/15 for pre planned right total knee arthroplasty. CSW met with pt / daughter to assist with d/c planning. Pt is planning to have ST Rehab at [facility] following hospital d/c. CSW has contacted [facility] and d/c plan has been confirmed. CSW will continue to follow to assist with d/c planning to SNF.  Employment status:  Retired Insurance information:  Managed Medicare PT Recommendations:  Skilled Nursing Facility Information / Referral to community resources:  Skilled Nursing Facility  Patient/Family's Response to care:  Pt's care cannot be managed at home at hospital d/c.  Patient/Family's Understanding of and Emotional Response to Diagnosis, Current Treatment, and Prognosis:  Pt / daughter have a good understanding of pt's medical status. Pt is motivated to work with therapy and is looking forward to having rehab at [facility].  Emotional Assessment Appearance:  Appears stated age Attitude/Demeanor/Rapport:  Other (cooperative) Affect (typically observed):  Calm, Pleasant, Appropriate Orientation:  Oriented to Self, Oriented to Place, Oriented to  Time, Oriented to Situation Alcohol / Substance use:  Not Applicable Psych involvement (Current and /or in the community):  No (Comment)  Discharge Needs  Concerns to be addressed:  Discharge Planning Concerns Readmission within the last 30 days:  No Current discharge risk:  None Barriers to Discharge:  No Barriers Identified   Haidinger, Jamie Lee, LCSW  [phone] 07/20/2015, 1:13 PM  

## 2015-07-20 NOTE — Clinical Social Work Placement (Signed)
   CLINICAL SOCIAL WORK PLACEMENT  NOTE  Date:  07/20/2015  Patient Details  Name: Tina Long MRN: [id number] Date of Birth: 1928/03/17  Clinical Social Work is seeking post-discharge placement for this patient at the Skilled  Nursing Facility level of care (*CSW will initial, date and re-position this form in  chart as items are completed):  No   Patient/family provided with [facility] Clinical Social Work Department's list of facilities offering this level of care within the geographic area requested by the patient (or if unable, by the patient's family).  Yes   Patient/family informed of their freedom to choose among providers that offer the needed level of care, that participate in Medicare, Medicaid or managed care program needed by the patient, have an available bed and are willing to accept the patient.      Patient/family informed of [facility]'s ownership interest in [facility] and [facility], as well as of the fact that they are under no obligation to receive care at these facilities.  PASRR submitted to EDS on 07/19/15     PASRR number received on 07/19/15     Existing PASRR number confirmed on       FL2 transmitted to all facilities in geographic area requested by pt/family on       FL2 transmitted to all facilities within larger geographic area on       Patient informed that his/her managed care company has contracts with or will negotiate with certain facilities, including the following:        Yes   Patient/family informed of bed offers received.  Patient chooses bed at [facility]     Physician recommends and patient chooses bed at      Patient to be transferred to [facility] on  .  Patient to be transferred to facility by       Patient family notified on   of transfer.  Name of family member notified:        PHYSICIAN       Additional Comment:    _______________________________________________ Vennie Homans   [phone] 07/20/2015, 1:43 PM

## 2015-07-20 NOTE — Progress Notes (Signed)
   Subjective: 1 Day Post-Op Procedure(s) (LRB): RIGHT TOTAL KNEE ARTHROPLASTY (Right) Patient reports pain as moderate.   Patient seen in rounds with Dr. Lequita Halt. Patient is well, and has had no acute complaints or problems other than pain in the right knee. No issues overnight. No SOB or chest pain.  Plan is to go Skilled nursing facility after hospital stay.  Objective: Vital signs in last 24 hours: Temp:  [97.5 F (36.4 C)-98.4 F (36.9 C)] 98 F (36.7 C) (08/18 0516) Pulse Rate:  [66-99] 87 (08/18 0516) Resp:  [13-22] 16 (08/18 0516) BP: (139-166)/(53-80) 166/77 mmHg (08/18 0516) SpO2:  [100 %] 100 % (08/18 0516) Weight:  [97.977 kg (216 lb)] 97.977 kg (216 lb) (08/17 1235)  Intake/Output from previous day:  Intake/Output Summary (Last 24 hours) at 07/20/15 0747 Last data filed at 07/20/15 0517  Gross per 24 hour  Intake   3930 ml  Output   1880 ml  Net   2050 ml     Labs:  Recent Labs  07/17/15 1400 07/20/15 0450  HGB 12.5 11.1*    Recent Labs  07/17/15 1400 07/20/15 0450  WBC 8.6 10.2  RBC 4.49 4.19  HCT 39.0 35.5*  PLT 288 266    Recent Labs  07/17/15 1400 07/20/15 0450  NA 136 135  K 3.6 4.2  CL 101 102  CO2 28 24  BUN 23* 17  CREATININE 1.02* 1.01*  GLUCOSE 108* 185*  CALCIUM 9.5 9.0    Recent Labs  07/17/15 1400  INR 0.93    EXAM General - Patient is Alert and Oriented Extremity - Neurologically intact Intact pulses distally Dorsiflexion/Plantar flexion intact Compartment soft Dressing - dressing C/D/I Motor Function - intact, moving foot and toes well on exam.  Hemovac pulled without difficulty.  Past Medical History  Diagnosis Date  . Hypertension   . Arthritis   . Sleep apnea     cpap   . Diabetes mellitus without complication     borderline   . Urinary urgency   . GERD (gastroesophageal reflux disease)     hx of     Assessment/Plan: 1 Day Post-Op Procedure(s) (LRB): RIGHT TOTAL KNEE ARTHROPLASTY  (Right) Principal Problem:   OA (osteoarthritis) of knee  Estimated body mass index is 35.94 kg/(m^2) as calculated from the following:   Height as of this encounter:  (1.651 m).   Weight as of this encounter: 97.977 kg (216 lb). Advance diet Up with therapy D/C IV fluids Discharge to SNF tomorrow or Saturday  DVT Prophylaxis - Xarelto Weight-Bearing as tolerated  D/C O2 and Pulse OX and try on Room Air  Will add oxycodone for breakthrough pain but patient may need benadryl in addition. Continue PT today.   Dimitri Ped, PA-C Orthopaedic Surgery 07/20/2015, 7:47 AM

## 2015-07-20 NOTE — Evaluation (Signed)
Occupational Therapy Evaluation Patient Details Name: Tina Long MRN: [id number] DOB: March 03, 1928 Today's Date: 07/20/2015    History of Present Illness 81 yo female adm  07/19/15 for R TKA; PMHx: HTN, OSA, DM   Clinical Impression   Pt admitted with above.  She demonstrates the below listed deficits and will benefit from skilled OT at SNF to maximize safety and independence with ADLs.  Pain 8-9/10, pt is very motivated.  All further OT needs can be addressed at SNF.       Follow Up Recommendations  SNF    Equipment Recommendations  None recommended by OT    Recommendations for Other Services       Precautions / Restrictions Precautions Precautions: Knee Required Braces or Orthoses: Knee Immobilizer - Right Knee Immobilizer - Right: Discontinue once straight leg raise with < 10 degree lag Restrictions Weight Bearing Restrictions: Yes Other Position/Activity Restrictions: WBAT      Mobility Bed Mobility Overal bed mobility: Needs Assistance Bed Mobility: Supine to Sit     Supine to sit: Min assist     General bed mobility comments: cues for technique adn assist with RLE  Transfers Overall transfer level: Needs assistance Equipment used: Rolling walker (2 wheeled) Transfers: Sit to/from Stand Sit to Stand: Min assist Stand pivot transfers: Min assist       General transfer comment: Min A to power up into standing and min A to steady during transfer     Balance Overall balance assessment: Needs assistance Sitting-balance support: Feet supported Sitting balance-Leahy Scale: Fair     Standing balance support: Bilateral upper extremity supported Standing balance-Leahy Scale: Poor                              ADL Overall ADL's : Needs assistance/impaired Eating/Feeding: Independent;Sitting   Grooming: Wash/dry hands;Wash/dry face;Oral care;Brushing hair;Set up;Sitting   Upper Body Bathing: Set up;Sitting   Lower Body Bathing: Moderate  assistance;Sit to/from stand   Upper Body Dressing : Set up;Sitting   Lower Body Dressing: Maximal assistance;Sit to/from stand Lower Body Dressing Details (indicate cue type and reason): Pt able to bend forward to ankle when attempting to don/doff sock  Toilet Transfer: Minimal assistance;Stand-pivot;BSC;RW   Toileting- Clothing Manipulation and Hygiene: Moderate assistance;Sit to/from stand       Functional mobility during ADLs: Minimal assistance;Rolling walker       Vision     Perception     Praxis      Pertinent Vitals/Pain Pain Assessment: 0-10 Pain Score: 9  Pain Location: Rt knee Pain Descriptors / Indicators: Aching;Constant;Sharp;Grimacing Pain Intervention(s): Monitored during session;Repositioned;Ice applied;Patient requesting pain meds-RN notified     Hand Dominance     Extremity/Trunk Assessment Upper Extremity Assessment Upper Extremity Assessment: Overall WFL for tasks assessed   Lower Extremity Assessment Lower Extremity Assessment: Defer to PT evaluation RLE Deficits / Details: hip flexion and hip extension grossly 2+/5; ankle AROM WFL       Communication Communication Communication: No difficulties   Cognition Arousal/Alertness: Awake/alert Behavior During Therapy: WFL for tasks assessed/performed Overall Cognitive Status: Within Functional Limits for tasks assessed                     General Comments       Exercises       Shoulder Instructions      Home Living Family/patient expects to be discharged to:: Skilled nursing facility Living Arrangements: Alone  Additional Comments: planning fo rehab at [facility]      Prior Functioning/Environment Level of Independence: Independent             OT Diagnosis: Generalized weakness;Acute pain   OT Problem List: Decreased strength;Decreased activity tolerance;Impaired balance (sitting and/or standing);Decreased safety  awareness;Decreased knowledge of use of DME or AE;Decreased knowledge of precautions;Pain   OT Treatment/Interventions:      OT Goals(Current goals can be found in the care plan section) Acute Rehab OT Goals Patient Stated Goal: to regain independence  OT Goal Formulation: All assessment and education complete, DC therapy  OT Frequency:     Barriers to D/C:            Co-evaluation              End of Session Equipment Utilized During Treatment: Rolling walker;Right knee immobilizer CPM Right Knee CPM Right Knee: Off Nurse Communication: Patient requests pain meds  Activity Tolerance: Patient limited by pain Patient left: in chair;with call bell/phone within reach;with family/visitor present   Time: 4098-1191 OT Time Calculation (min): 18 min Charges:  OT General Charges $OT Visit: 1 Procedure OT Evaluation $Initial OT Evaluation Tier I: 1 Procedure G-Codes:    Rogerick Baldwin, Ursula Alert M Aug 04, 2015, 1:42 PM

## 2015-07-20 NOTE — Evaluation (Signed)
Physical Therapy Evaluation Patient Details Name: Tina Long MRN: [id number] DOB: 31-Oct-1928 Today's Date: 07/20/2015   History of Present Illness  81 yo female adm  07/19/15 for R TKA; PMHx: HTN, OSA, DM  Clinical Impression  Pt admitted with above diagnosis. Pt currently with functional limitations due to the deficits listed below (see PT Problem List).  Pt will benefit from skilled PT to increase their independence and safety with mobility to allow discharge to the venue listed below.  Pt will need SNF post acute     Follow Up Recommendations SNF    Equipment Recommendations  Rolling walker with 5" wheels    Recommendations for Other Services       Precautions / Restrictions Precautions Precautions: Knee Required Braces or Orthoses: Knee Immobilizer - Right Knee Immobilizer - Right: Discontinue once straight leg raise with < 10 degree lag Restrictions Weight Bearing Restrictions: Yes Other Position/Activity Restrictions: WBAT      Mobility  Bed Mobility Overal bed mobility: Needs Assistance Bed Mobility: Supine to Sit     Supine to sit: Min assist     General bed mobility comments: cues for technique adn assist with RLE  Transfers Overall transfer level: Needs assistance Equipment used: Rolling walker (2 wheeled) Transfers: Sit to/from Stand Sit to Stand: Min assist Stand pivot transfers: Min assist       General transfer comment: Min A to power up into standing and min A to steady during transfer   Ambulation/Gait Ambulation/Gait assistance: Min assist Ambulation Distance (Feet): 25 Feet Assistive device: Rolling walker (2 wheeled) Gait Pattern/deviations: Step-to pattern     General Gait Details: cues for sequence, RW position from self  Stairs            Wheelchair Mobility    Modified Rankin (Stroke Patients Only)       Balance Overall balance assessment: Needs assistance Sitting-balance support: Feet supported Sitting  balance-Leahy Scale: Fair     Standing balance support: Bilateral upper extremity supported Standing balance-Leahy Scale: Poor                               Pertinent Vitals/Pain Pain Assessment: 0-10 Pain Score: 3 Pain Location: Rt knee Pain Descriptors / Indicators: Aching;Constant;Sharp;Grimacing Pain Intervention(s): Monitored during session;Repositioned;Ice applied;pt premedicated    Home Living Family/patient expects to be discharged to:: Skilled nursing facility Living Arrangements: Alone               Additional Comments: planning fo rehab at [facility]    Prior Function Level of Independence: Independent               Hand Dominance        Extremity/Trunk Assessment   Upper Extremity Assessment: Overall WFL for tasks assessed           Lower Extremity Assessment: Defer to PT evaluation RLE Deficits / Details: hip flexion and hip extension grossly 2+/5; ankle AROM WFL       Communication   Communication: No difficulties  Cognition Arousal/Alertness: Awake/alert Behavior During Therapy: WFL for tasks assessed/performed Overall Cognitive Status: Within Functional Limits for tasks assessed                      General Comments      Exercises Total Joint Exercises Ankle Circles/Pumps: AROM;Both;10 reps Quad Sets: 10 reps;Both;AROM Heel Slides: AROM;Both;10 reps Straight Leg Raises: AROM;Both;10 reps  Assessment/Plan    PT Assessment Patient needs continued PT services  PT Diagnosis Difficulty walking   PT Problem List Decreased strength;Decreased range of motion;Decreased activity tolerance;Decreased mobility;Decreased knowledge of use of DME  PT Treatment Interventions DME instruction;Gait training;Functional mobility training;Therapeutic activities;Patient/family education;Therapeutic exercise   PT Goals (Current goals can be found in the Care Plan section) Acute Rehab PT Goals Patient Stated Goal: to  regain independence  PT Goal Formulation: With patient Time For Goal Achievement: 07/27/15 Potential to Achieve Goals: Good    Frequency 7X/week   Barriers to discharge        Co-evaluation               End of Session Equipment Utilized During Treatment: Gait belt Activity Tolerance: Patient tolerated treatment well Patient left: in bed;with call bell/phone within reach;with family/visitor present           Time: 1610-9604 PT Time Calculation (min) (ACUTE ONLY): 30 min   Charges:   PT Evaluation $Initial PT Evaluation Tier I: 1 Procedure PT Treatments $Gait Training: 8-22 mins   PT G Codes:        Cally Nygard 2015/08/16, 1:46 PM

## 2015-07-20 NOTE — Progress Notes (Signed)
Pt states she does not need any assistance with CPAP and will self administer when ready. Pt is using home ffm and tubing. CPAP is set on auto titrate 5-20cmH2O on room air. RT will continue to monitor as needed.

## 2015-07-20 NOTE — Progress Notes (Signed)
   07/20/15 1500  PT Visit Information  Last PT Received On 07/20/15  Assistance Needed +1  History of Present Illness 81 yo female adm  07/19/15 for R TKA; PMHx: HTN, OSA, DM  PT Time Calculation  PT Start Time (ACUTE ONLY) 1506  PT Stop Time (ACUTE ONLY) 1527  PT Time Calculation (min) (ACUTE ONLY) 21 min  Subjective Data  Patient Stated Goal to regain independence   Precautions  Precautions Knee  Required Braces or Orthoses Knee Immobilizer - Right  Knee Immobilizer - Right Discontinue once straight leg raise with < 10 degree lag  Restrictions  Weight Bearing Restrictions No  Other Position/Activity Restrictions WBAT  Pain Assessment  Pain Assessment 0-10  Pain Score 7  Pain Location R knee  Pain Descriptors / Indicators Sore  Pain Intervention(s) Limited activity within patient's tolerance;Monitored during session;Repositioned  Cognition  Arousal/Alertness Awake/alert  Behavior During Therapy WFL for tasks assessed/performed  Overall Cognitive Status Within Functional Limits for tasks assessed  Bed Mobility  Overal bed mobility Needs Assistance  Bed Mobility Sit to Supine  Sit to supine Min assist;Mod assist  General bed mobility comments cues for technique and assist with RLE  Transfers  Overall transfer level Needs assistance  Equipment used Rolling walker (2 wheeled)  Transfers Sit to/from Stand  Sit to Stand Min assist  General transfer comment min assist for wt shift and to steady during standing; cues for hand placement and RLE position  Ambulation/Gait  Ambulation/Gait assistance Min assist  Ambulation Distance (Feet) 5 Feet  Assistive device Rolling walker (2 wheeled)  Gait Pattern/deviations Step-to pattern;Antalgic;Trunk flexed  General Gait Details cues for sequence, RW position from self  Balance  Overall balance assessment Needs assistance  Sitting balance-Leahy Scale Fair  Standing balance support During functional activity;Bilateral upper extremity  supported  Standing balance-Leahy Scale Poor  Total Joint Exercises  Ankle Circles/Pumps AROM;Both;10 reps  Heel Slides AROM;Both;10 reps;AAROM  PT - End of Session  Equipment Utilized During Treatment Right knee immobilizer;Gait belt  Activity Tolerance Patient limited by pain  Patient left in bed;with call bell/phone within reach;with family/visitor present  Nurse Communication Mobility status  PT - Assessment/Plan  PT Plan Current plan remains appropriate  PT Frequency (ACUTE ONLY) 7X/week  Follow Up Recommendations SNF  PT equipment Rolling walker with 5" wheels  PT Goal Progression  Progress towards PT goals Progressing toward goals  Acute Rehab PT Goals  PT Goal Formulation With patient  Time For Goal Achievement 07/27/15  Potential to Achieve Goals Good  PT General Charges  $$ ACUTE PT VISIT 1 Procedure  PT Treatments  $Therapeutic Activity 8-22 mins

## 2015-07-21 LAB — BASIC METABOLIC PANEL
ANION GAP: 8 (ref 5–15)
BUN: 17 mg/dL (ref 6–20)
CALCIUM: 9.2 mg/dL (ref 8.9–10.3)
CO2: 27 mmol/L (ref 22–32)
Chloride: 101 mmol/L (ref 101–111)
Creatinine, Ser: 0.98 mg/dL (ref 0.44–1.00)
GFR, EST AFRICAN AMERICAN: 59 mL/min — AB (ref >60)
GFR, EST NON AFRICAN AMERICAN: 51 mL/min — AB (ref >60)
Glucose, Bld: 126 mg/dL — ABNORMAL HIGH (ref 65–99)
Potassium: 4.3 mmol/L (ref 3.5–5.1)
Sodium: 136 mmol/L (ref 135–145)

## 2015-07-21 LAB — CBC
HEMATOCRIT: 32.2 % — AB (ref 36.0–46.0)
Hemoglobin: 10.2 g/dL — ABNORMAL LOW (ref 12.0–15.0)
MCH: 27 pg (ref 26.0–34.0)
MCHC: 31.7 g/dL (ref 30.0–36.0)
MCV: 85.2 fL (ref 78.0–100.0)
PLATELETS: 285 10*3/uL (ref 150–400)
RBC: 3.78 MIL/uL — ABNORMAL LOW (ref 3.87–5.11)
RDW: 13.8 % (ref 11.5–15.5)
WBC: 14 10*3/uL — AB (ref 4.0–10.5)

## 2015-07-21 NOTE — Progress Notes (Signed)
   Subjective: 2 Days Post-Op Procedure(s) (LRB): RIGHT TOTAL KNEE ARTHROPLASTY (Right) Patient reports pain as mild.   Patient seen in rounds with Dr. Lequita Halt. Patient is well, and has had no acute complaints or problems. No issues overnight. Plan is to go Skilled nursing facility after hospital stay.  Objective: Vital signs in last 24 hours: Temp:  [97.8 F (36.6 C)-98.6 F (37 C)] 98.5 F (36.9 C) (08/19 0601) Pulse Rate:  [57-84] 62 (08/19 0601) Resp:  [16-17] 16 (08/19 0601) BP: (151-167)/(51-72) 151/62 mmHg (08/19 0601) SpO2:  [96 %-100 %] 97 % (08/19 0601)  Intake/Output from previous day:  Intake/Output Summary (Last 24 hours) at 07/21/15 0759 Last data filed at 07/21/15 0631  Gross per 24 hour  Intake 2367.5 ml  Output   2900 ml  Net -532.5 ml     Labs:  Recent Labs  07/20/15 0450 07/21/15 0549  HGB 11.1* 10.2*    Recent Labs  07/20/15 0450 07/21/15 0549  WBC 10.2 14.0*  RBC 4.19 3.78*  HCT 35.5* 32.2*  PLT 266 285    Recent Labs  07/20/15 0450 07/21/15 0549  NA 135 136  K 4.2 4.3  CL 102 101  CO2 24 27  BUN 17 17  CREATININE 1.01* 0.98  GLUCOSE 185* 126*  CALCIUM 9.0 9.2    EXAM General - Patient is Alert and Oriented Extremity - Neurologically intact Intact pulses distally Dorsiflexion/Plantar flexion intact Compartment soft Dressing/Incision - clean, dry, no drainage Motor Function - intact, moving foot and toes well on exam.   Past Medical History  Diagnosis Date  . Hypertension   . Arthritis   . Sleep apnea     cpap   . Diabetes mellitus without complication     borderline   . Urinary urgency   . GERD (gastroesophageal reflux disease)     hx of     Assessment/Plan: 2 Days Post-Op Procedure(s) (LRB): RIGHT TOTAL KNEE ARTHROPLASTY (Right) Principal Problem:   OA (osteoarthritis) of knee  Estimated body mass index is 35.94 kg/(m^2) as calculated from the following:   Height as of this encounter:  (1.651 m).  Weight as of this encounter: 97.977 kg (216 lb). Advance diet Up with therapy D/C IV fluids Discharge to SNF  DVT Prophylaxis - Xarelto Weight-Bearing as tolerated    Dimitri Ped, PA-C Orthopaedic Surgery 07/21/2015, 7:59 AM

## 2015-07-21 NOTE — Clinical Social Work Placement (Signed)
   CLINICAL SOCIAL WORK PLACEMENT  NOTE  Date:  07/21/2015  Patient Details  Name: Tina Long MRN: [id number] Date of Birth: 1928/10/04  Clinical Social Work is seeking post-discharge placement for this patient at the Skilled  Nursing Facility level of care (*CSW will initial, date and re-position this form in  chart as items are completed):  No   Patient/family provided with [facility] Clinical Social Work Department's list of facilities offering this level of care within the geographic area requested by the patient (or if unable, by the patient's family).  Yes   Patient/family informed of their freedom to choose among providers that offer the needed level of care, that participate in Medicare, Medicaid or managed care program needed by the patient, have an available bed and are willing to accept the patient.      Patient/family informed of [facility]'s ownership interest in [facility] and [facility], as well as of the fact that they are under no obligation to receive care at these facilities.  PASRR submitted to EDS on 07/19/15     PASRR number received on 07/19/15     Existing PASRR number confirmed on       FL2 transmitted to all facilities in geographic area requested by pt/family on       FL2 transmitted to all facilities within larger geographic area on       Patient informed that his/her managed care company has contracts with or will negotiate with certain facilities, including the following:        Yes   Patient/family informed of bed offers received.  Patient chooses bed at [facility]     Physician recommends and patient chooses bed at      Patient to be transferred to [facility] on 07/21/15.  Patient to be transferred to facility by CAR     Patient family notified on 07/21/15 of transfer.  Name of family member notified:  DAUGHTER     PHYSICIAN       Additional Comment: Pt / Daughter are in agreement with d/c to [facility] today. PT has  approved transport by car. NSG has reviewed d/c summary, scripts, avs. Scripts have been included in d/c packet. D/C Summary has been sent to SNF for review prior to d/c. D/C packet has been provided to pt prior to d/c.   _______________________________________________ Royetta Asal, LCSW  [phone] 07/21/2015, 2:07 PM

## 2015-07-21 NOTE — Progress Notes (Signed)
Physical Therapy Treatment Patient Details Name: Tina Long MRN: [id number] DOB: [birth year] Today's Date: 07/21/2015    History of Present Illness 81 yo female adm  07/19/15 for R TKA; PMHx: HTN, OSA, DM    PT Comments    Patient progressing very well today . Going to rehab.  Follow Up Recommendations  SNF     Equipment Recommendations       Recommendations for Other Services       Precautions / Restrictions      Mobility  Bed Mobility   Bed Mobility: Supine to Sit     Supine to sit: Min assist     General bed mobility comments: cues for technique and assist with RLE  Transfers   Equipment used: Rolling walker (2 wheeled) Transfers: Sit to/from Stand Sit to Stand: Min assist         General transfer comment: cues for hand and R leg position, position RW in fronnt of patient.  Ambulation/Gait Ambulation/Gait assistance: Min assist Ambulation Distance (Feet): 120 Feet Assistive device: Rolling walker (2 wheeled) Gait Pattern/deviations: Step-through pattern;Step-to pattern     General Gait Details: cues for sequence, RW position from self   Stairs            Wheelchair Mobility    Modified Rankin (Stroke Patients Only)       Balance                                    Cognition Arousal/Alertness: Awake/alert                          Exercises      General Comments        Pertinent Vitals/Pain Pain Score: 7  Pain Location: R knee Pain Descriptors / Indicators: Sore;Aching Pain Intervention(s): Limited activity within patient's tolerance;Monitored during session;Premedicated before session    Home Living                      Prior Function            PT Goals (current goals can now be found in the care plan section) Progress towards PT goals: Progressing toward goals    Frequency  7X/week    PT Plan Current plan remains appropriate    Co-evaluation             End of  Session Equipment Utilized During Treatment: Right knee immobilizer Activity Tolerance: Patient tolerated treatment well Patient left: in chair;with call bell/phone within reach;with family/visitor present     Time: 1047-1101 PT Time Calculation (min) (ACUTE ONLY): 14 min  Charges:  $Gait Training: 8-22 mins                    G Codes:      Rada Hay 07/21/2015, 4:10 PM

## 2015-07-21 NOTE — Discharge Summary (Signed)
Physician Discharge Summary   Patient ID: Tina Long MRN: [id number] DOB/AGE: May 01, 1928 81 y.o.  Admit date: 07/19/2015 Discharge date: 07/21/2015  Primary Diagnosis: Primary osteoarthritis, right knee Admission Diagnoses:  Past Medical History  Diagnosis Date  . Hypertension   . Arthritis   . Sleep apnea     cpap   . Diabetes mellitus without complication     borderline   . Urinary urgency   . GERD (gastroesophageal reflux disease)     hx of    Discharge Diagnoses:   Principal Problem:   OA (osteoarthritis) of knee  Estimated body mass index is 35.94 kg/(m^2) as calculated from the following:   Height as of this encounter: _0  (1.651 m).   Weight as of this encounter: 97.977 kg (216 lb).  Procedure:  Procedure(s) (LRB): RIGHT TOTAL KNEE ARTHROPLASTY (Right)   Consults: None  HPI: The patient is a 81 year old female who presents with knee complaints. The patient was seen for a second opinion. The patient reports right knee symptoms including: pain, instability, giving way, weakness, stiffness and grinding which began year(s) ago without any known injury.The patient feels that the symptoms are worsening. The patient has the current diagnosis of knee osteoarthritis. Prior to being seen, the patient was previously evaluated by a colleague. Past treatment for this problem has included intra-articular injection of corticosteroids (also had repeat series of Synvisc injections in both knees completed in November 2015. She reports she got good relief in both knees with the first Synvisc series, but no relief in the right knee after the second series) and nonsteroidal anti-inflammatory drugs. Current treatment includes oral corticosteroids (currently taking prednisone). She said that the right knee is bothering her at all time. It has been getting progressively worse over time. She has been treated in [place] at an orthopaedic clinic in [place] with injections. She was told  that she was at a stage where she had to consider knee replacement. Her family lives here. She is here today for evaluation and consideration of surgical treatment. She said the right knee hurts far more than the left. The right knee limits when she can and cannot do. She is at a stage where it is hurting her all the time including at night. She is not having any associated hip pain, lower extremity weakness, or paresthesia with this. She was orginally scheduled back in April but due to some scheduling conflicts, she was rescheduled until 07/19/2015. They have been treated conservatively in the past for the above stated problem and despite conservative measures, they continue to have progressive pain and severe functional limitations and dysfunction. They have failed non-operative management including home exercise, medications, and injections. It is felt that they would benefit from undergoing total joint replacement. Risks and benefits of the procedure have been discussed with the patient and they elect to proceed with surgery. There are no active contraindications to surgery such as ongoing infection or rapidly progressive neurological disease.  Laboratory Data: Admission on 07/19/2015  Component Date Value Ref Range Status  . Glucose-Capillary 07/19/2015 93  65 - 99 mg/dL Final  . Comment 1 07/19/2015 Document in Chart   Final  . WBC 07/20/2015 10.2  4.0 - 10.5 K/uL Final  . RBC 07/20/2015 4.19  3.87 - 5.11 MIL/uL Final  . Hemoglobin 07/20/2015 11.1* 12.0 - 15.0 g/dL Final  . HCT 07/20/2015 35.5* 36.0 - 46.0 % Final  . MCV 07/20/2015 84.7  78.0 - 100.0 fL Final  . MCH  07/20/2015 26.5  26.0 - 34.0 pg Final  . MCHC 07/20/2015 31.3  30.0 - 36.0 g/dL Final  . RDW 07/20/2015 13.7  11.5 - 15.5 % Final  . Platelets 07/20/2015 266  150 - 400 K/uL Final  . Sodium 07/20/2015 135  135 - 145 mmol/L Final  . Potassium 07/20/2015 4.2  3.5 - 5.1 mmol/L Final  . Chloride 07/20/2015 102  101 - 111 mmol/L  Final  . CO2 07/20/2015 24  22 - 32 mmol/L Final  . Glucose, Bld 07/20/2015 185* 65 - 99 mg/dL Final  . BUN 07/20/2015 17  6 - 20 mg/dL Final  . Creatinine, Ser 07/20/2015 1.01* 0.44 - 1.00 mg/dL Final  . Calcium 07/20/2015 9.0  8.9 - 10.3 mg/dL Final  . GFR calc non Af Amer 07/20/2015 49* >60 mL/min Final  . GFR calc Af Amer 07/20/2015 57* >60 mL/min Final   Comment: (NOTE) The eGFR has been calculated using the CKD EPI equation. This calculation has not been validated in all clinical situations. eGFR's persistently <60 mL/min signify possible Chronic Kidney Disease.   . Anion gap 07/20/2015 9  5 - 15 Final  . WBC 07/21/2015 14.0* 4.0 - 10.5 K/uL Final  . RBC 07/21/2015 3.78* 3.87 - 5.11 MIL/uL Final  . Hemoglobin 07/21/2015 10.2* 12.0 - 15.0 g/dL Final  . HCT 07/21/2015 32.2* 36.0 - 46.0 % Final  . MCV 07/21/2015 85.2  78.0 - 100.0 fL Final  . MCH 07/21/2015 27.0  26.0 - 34.0 pg Final  . MCHC 07/21/2015 31.7  30.0 - 36.0 g/dL Final  . RDW 07/21/2015 13.8  11.5 - 15.5 % Final  . Platelets 07/21/2015 285  150 - 400 K/uL Final  . Sodium 07/21/2015 136  135 - 145 mmol/L Final  . Potassium 07/21/2015 4.3  3.5 - 5.1 mmol/L Final  . Chloride 07/21/2015 101  101 - 111 mmol/L Final  . CO2 07/21/2015 27  22 - 32 mmol/L Final  . Glucose, Bld 07/21/2015 126* 65 - 99 mg/dL Final  . BUN 07/21/2015 17  6 - 20 mg/dL Final  . Creatinine, Ser 07/21/2015 0.98  0.44 - 1.00 mg/dL Final  . Calcium 07/21/2015 9.2  8.9 - 10.3 mg/dL Final  . GFR calc non Af Amer 07/21/2015 51* >60 mL/min Final  . GFR calc Af Amer 07/21/2015 59* >60 mL/min Final   Comment: (NOTE) The eGFR has been calculated using the CKD EPI equation. This calculation has not been validated in all clinical situations. eGFR's persistently <60 mL/min signify possible Chronic Kidney Disease.   Georgiann Hahn gap 07/21/2015 8  5 - 15 Final  Hospital Outpatient Visit on 07/17/2015  Component Date Value Ref Range Status  . aPTT 07/17/2015 34   24 - 37 seconds Final  . WBC 07/17/2015 8.6  4.0 - 10.5 K/uL Final  . RBC 07/17/2015 4.49  3.87 - 5.11 MIL/uL Final  . Hemoglobin 07/17/2015 12.5  12.0 - 15.0 g/dL Final  . HCT 07/17/2015 39.0  36.0 - 46.0 % Final  . MCV 07/17/2015 86.9  78.0 - 100.0 fL Final  . MCH 07/17/2015 27.8  26.0 - 34.0 pg Final  . MCHC 07/17/2015 32.1  30.0 - 36.0 g/dL Final  . RDW 07/17/2015 14.1  11.5 - 15.5 % Final  . Platelets 07/17/2015 288  150 - 400 K/uL Final  . Sodium 07/17/2015 136  135 - 145 mmol/L Final  . Potassium 07/17/2015 3.6  3.5 - 5.1 mmol/L Final  . Chloride 07/17/2015  101  101 - 111 mmol/L Final  . CO2 07/17/2015 28  22 - 32 mmol/L Final  . Glucose, Bld 07/17/2015 108* 65 - 99 mg/dL Final  . BUN 07/17/2015 23* 6 - 20 mg/dL Final  . Creatinine, Ser 07/17/2015 1.02* 0.44 - 1.00 mg/dL Final  . Calcium 07/17/2015 9.5  8.9 - 10.3 mg/dL Final  . Total Protein 07/17/2015 7.4  6.5 - 8.1 g/dL Final  . Albumin 07/17/2015 4.0  3.5 - 5.0 g/dL Final  . AST 07/17/2015 18  15 - 41 U/L Final  . ALT 07/17/2015 11* 14 - 54 U/L Final  . Alkaline Phosphatase 07/17/2015 64  38 - 126 U/L Final  . Total Bilirubin 07/17/2015 0.6  0.3 - 1.2 mg/dL Final  . GFR calc non Af Amer 07/17/2015 49* >60 mL/min Final  . GFR calc Af Amer 07/17/2015 56* >60 mL/min Final   Comment: (NOTE) The eGFR has been calculated using the CKD EPI equation. This calculation has not been validated in all clinical situations. eGFR's persistently <60 mL/min signify possible Chronic Kidney Disease.   . Anion gap 07/17/2015 7  5 - 15 Final  . Prothrombin Time 07/17/2015 12.7  11.6 - 15.2 seconds Final  . INR 07/17/2015 0.93  0.00 - 1.49 Final  . ABO/RH(D) 07/17/2015 O POS   Final  . Antibody Screen 07/17/2015 NEG   Final  . Sample Expiration 07/17/2015 07/22/2015   Final  . Color, Urine 07/17/2015 YELLOW  YELLOW Final  . APPearance 07/17/2015 CLEAR  CLEAR Final  . Specific Gravity, Urine 07/17/2015 1.007  1.005 - 1.030 Final  . pH  07/17/2015 6.5  5.0 - 8.0 Final  . Glucose, UA 07/17/2015 NEGATIVE  NEGATIVE mg/dL Final  . Hgb urine dipstick 07/17/2015 SMALL* NEGATIVE Final  . Bilirubin Urine 07/17/2015 NEGATIVE  NEGATIVE Final  . Ketones, ur 07/17/2015 NEGATIVE  NEGATIVE mg/dL Final  . Protein, ur 07/17/2015 NEGATIVE  NEGATIVE mg/dL Final  . Urobilinogen, UA 07/17/2015 1.0  0.0 - 1.0 mg/dL Final  . Nitrite 07/17/2015 NEGATIVE  NEGATIVE Final  . Leukocytes, UA 07/17/2015 NEGATIVE  NEGATIVE Final  . MRSA, PCR 07/17/2015 NEGATIVE  NEGATIVE Final  . Staphylococcus aureus 07/17/2015 NEGATIVE  NEGATIVE Final   Comment:        The Xpert SA Assay (FDA approved for NASAL specimens in patients over 77 years of age), is one component of a comprehensive surveillance program.  Test performance has been validated by [facility] for patients greater than or equal to 62 year old. It is not intended to diagnose infection nor to guide or monitor treatment.   . RBC / HPF 07/17/2015 0-2  <3 RBC/hpf Final      EKG: Orders placed or performed in visit on 04/17/15  . EKG 12-Lead     Hospital Course: JULLIANNA GABOR is a 81 y.o. who was admitted to [facility]. They were brought to the operating room on 07/19/2015 and underwent Procedure(s): RIGHT TOTAL KNEE ARTHROPLASTY.  Patient tolerated the procedure well and was later transferred to the recovery room and then to the orthopaedic floor for postoperative care.  They were given PO and IV analgesics for pain control following their surgery.  They were given 24 hours of postoperative antibiotics of  Anti-infectives    Start     Dose/Rate Route Frequency Ordered Stop   07/19/15 2000  ceFAZolin (ANCEF) IVPB 2 g/50 mL premix     2 g 100 mL/hr over 30 Minutes Intravenous Every  6 hours 07/19/15 1709 07/20/15 0338   07/19/15 1230  ceFAZolin (ANCEF) IVPB 2 g/50 mL premix     2 g 100 mL/hr over 30 Minutes Intravenous On call to O.R. 07/19/15 1213 07/19/15 1339   07/19/15 0600   ceFAZolin (ANCEF) IVPB 2 g/50 mL premix  Status:  Discontinued     2 g 100 mL/hr over 30 Minutes Intravenous On call to O.R. 07/18/15 1351 07/18/15 1352     and started on DVT prophylaxis in the form of Xarelto.   PT and OT were ordered for total joint protocol.  Discharge planning consulted to help with postop disposition and equipment needs.  Patient had a fair night on the evening of surgery.  They started to get up OOB with therapy on day one. Hemovac drain was pulled without difficulty.  Continued to work with therapy into day two.  Dressing was changed on day two and the incision was clean and dry.  Patient was seen in rounds and was ready to go to SNF.   Diet: Cardiac diet Activity:WBAT Follow-up:in 2 weeks Disposition - Skilled nursing facility Discharged Condition: stable   Discharge Instructions    Call MD / Call 911    Complete by:  As directed   If you experience chest pain or shortness of breath, CALL 911 and be transported to the hospital emergency room.  If you develope a fever above 101 F, pus (white drainage) or increased drainage or redness at the wound, or calf pain, call your surgeon's office.     Constipation Prevention    Complete by:  As directed   Drink plenty of fluids.  Prune juice may be helpful.  You may use a stool softener, such as Colace (over the counter) 100 mg twice a day.  Use MiraLax (over the counter) for constipation as needed.     Diet - low sodium heart healthy    Complete by:  As directed      Discharge instructions    Complete by:  As directed   INSTRUCTIONS AFTER JOINT REPLACEMENT   Remove items at home which could result in a fall. This includes throw rugs or furniture in walking pathways ICE to the affected joint every three hours while awake for 30 minutes at a time, for at least the first 3-5 days, and then as needed for pain and swelling.  Continue to use ice for pain and swelling. You may notice swelling that will progress down to the foot  and ankle.  This is normal after surgery.  Elevate your leg when you are not up walking on it.   Continue to use the breathing machine you got in the hospital (incentive spirometer) which will help keep your temperature down.  It is common for your temperature to cycle up and down following surgery, especially at night when you are not up moving around and exerting yourself.  The breathing machine keeps your lungs expanded and your temperature down.   DIET:  As you were doing prior to hospitalization, we recommend a well-balanced diet.  DRESSING / WOUND CARE / SHOWERING  You may change your dressing 3-5 days after surgery.  Then change the dressing every day with sterile gauze.  Please use good hand washing techniques before changing the dressing.  Do not use any lotions or creams on the incision until instructed by your surgeon.  ACTIVITY  Increase activity slowly as tolerated, but follow the weight bearing instructions below.   No driving for 6  weeks or until further direction given by your physician.  You cannot drive while taking narcotics.  No lifting or carrying greater than 10 lbs. until further directed by your surgeon. Avoid periods of inactivity such as sitting longer than an hour when not asleep. This helps prevent blood clots.  You may return to work once you are authorized by your doctor.     WEIGHT BEARING   Weight bearing as tolerated with assist device (walker, cane, etc) as directed, use it as long as suggested by your surgeon or therapist, typically at least 4-6 weeks.   EXERCISES  Results after joint replacement surgery are often greatly improved when you follow the exercise, range of motion and muscle strengthening exercises prescribed by your doctor. Safety measures are also important to protect the joint from further injury. Any time any of these exercises cause you to have increased pain or swelling, decrease what you are doing until you are comfortable again and  then slowly increase them. If you have problems or questions, call your caregiver or physical therapist for advice.   Rehabilitation is important following a joint replacement. After just a few days of immobilization, the muscles of the leg can become weakened and shrink (atrophy).  These exercises are designed to build up the tone and strength of the thigh and leg muscles and to improve motion. Often times heat used for twenty to thirty minutes before working out will loosen up your tissues and help with improving the range of motion but do not use heat for the first two weeks following surgery (sometimes heat can increase post-operative swelling).   These exercises can be done on a training (exercise) mat, on the floor, on a table or on a bed. Use whatever works the best and is most comfortable for you.    Use music or television while you are exercising so that the exercises are a pleasant break in your day. This will make your life better with the exercises acting as a break in your routine that you can look forward to.   Perform all exercises about fifteen times, three times per day or as directed.  You should exercise both the operative leg and the other leg as well.   Exercises include:   Quad Sets - Tighten up the muscle on the front of the thigh (Quad) and hold for 5-10 seconds.   Straight Leg Raises - With your knee straight (if you were given a brace, keep it on), lift the leg to 60 degrees, hold for 3 seconds, and slowly lower the leg.  Perform this exercise against resistance later as your leg gets stronger.  Leg Slides: Lying on your back, slowly slide your foot toward your buttocks, bending your knee up off the floor (only go as far as is comfortable). Then slowly slide your foot back down until your leg is flat on the floor again.  Angel Wings: Lying on your back spread your legs to the side as far apart as you can without causing discomfort.  Hamstring Strength:  Lying on your back, push  your heel against the floor with your leg straight by tightening up the muscles of your buttocks.  Repeat, but this time bend your knee to a comfortable angle, and push your heel against the floor.  You may put a pillow under the heel to make it more comfortable if necessary.   A rehabilitation program following joint replacement surgery can speed recovery and prevent re-injury in the future due  to weakened muscles. Contact your doctor or a physical therapist for more information on knee rehabilitation.    CONSTIPATION  Constipation is defined medically as fewer than three stools per week and severe constipation as less than one stool per week.  Even if you have a regular bowel pattern at home, your normal regimen is likely to be disrupted due to multiple reasons following surgery.  Combination of anesthesia, postoperative narcotics, change in appetite and fluid intake all can affect your bowels.   YOU MUST use at least one of the following options; they are listed in order of increasing strength to get the job done.  They are all available over the counter, and you may need to use some, POSSIBLY even all of these options:    Drink plenty of fluids (prune juice may be helpful) and high fiber foods Colace 100 mg by mouth twice a day  Senokot for constipation as directed and as needed Dulcolax (bisacodyl), take with full glass of water  Miralax (polyethylene glycol) once or twice a day as needed.  If you have tried all these things and are unable to have a bowel movement in the first 3-4 days after surgery call either your surgeon or your primary doctor.    If you experience loose stools or diarrhea, hold the medications until you stool forms back up.  If your symptoms do not get better within 1 week or if they get worse, check with your doctor.  If you experience "the worst abdominal pain ever" or develop nausea or vomiting, please contact the office immediately for further recommendations for  treatment.   ITCHING:  If you experience itching with your medications, try taking only a single pain pill, or even half a pain pill at a time.  You can also use Benadryl over the counter for itching or also to help with sleep.   TED HOSE STOCKINGS:  Use stockings on both legs until for at least 2 weeks or as directed by physician office. They may be removed at night for sleeping.  MEDICATIONS:  See your medication summary on the "After Visit Summary" that nursing will review with you.  You may have some home medications which will be placed on hold until you complete the course of blood thinner medication.  It is important for you to complete the blood thinner medication as prescribed.  PRECAUTIONS:  If you experience chest pain or shortness of breath - call 911 immediately for transfer to the hospital emergency department.   If you develop a fever greater that 101 F, purulent drainage from wound, increased redness or drainage from wound, foul odor from the wound/dressing, or calf pain - CONTACT YOUR SURGEON.                                                   FOLLOW-UP APPOINTMENTS:  If you do not already have a post-op appointment, please call the office for an appointment to be seen by your surgeon.  Guidelines for how soon to be seen are listed in your "After Visit Summary", but are typically between 1-4 weeks after surgery.    MAKE SURE YOU:  Understand these instructions.  Get help right away if you are not doing well or get worse.    Thank you for letting us be a part of your medical care team.  It is a privilege we respect greatly.  We hope these instructions will help you stay on track for a fast and full recovery!     Increase activity slowly as tolerated    Complete by:  As directed             Medication List    STOP taking these medications        multivitamin with minerals Tabs tablet     polyvinyl alcohol 1.4 % ophthalmic solution  Commonly known as:  LIQUIFILM  TEARS     Vitamin D3 2000 UNITS capsule      TAKE these medications        docusate sodium 100 MG capsule  Commonly known as:  COLACE  Take 1 capsule (100 mg total) by mouth 2 (two) times daily.     HYDROcodone-acetaminophen 5-325 MG per tablet  Commonly known as:  NORCO/VICODIN  Take 1 tablet by mouth every 4 (four) hours as needed for moderate pain.     losartan-hydrochlorothiazide 100-25 MG per tablet  Commonly known as:  HYZAAR  Take 1 tablet by mouth every morning.     methocarbamol 500 MG tablet  Commonly known as:  ROBAXIN  Take 1 tablet (500 mg total) by mouth every 6 (six) hours as needed for muscle spasms.     oxyCODONE 5 MG immediate release tablet  Commonly known as:  Oxy IR/ROXICODONE  Take 1-2 tablets (5-10 mg total) by mouth every 3 (three) hours as needed for breakthrough pain.     predniSONE 5 MG tablet  Commonly known as:  DELTASONE  Take 5 mg by mouth daily as needed (flares/ mobility.).     rivaroxaban 10 MG Tabs tablet  Commonly known as:  XARELTO  Take 1 tablet (10 mg total) by mouth daily with breakfast.     UNABLE TO FIND  C-Pap           Follow-up Information    Follow up with Gearlean Alf, MD. Schedule an appointment as soon as possible for a visit on 08/03/2015.   Specialty:  Orthopedic Surgery   Contact information:   [address] [place] [address] [phone]      CPAP Settings: auto titrate 5-20cmH2O on room air  Signed: Ardeen Jourdain, PA-C Orthopaedic Surgery 07/21/2015, 8:00 AM

## 2015-07-21 NOTE — Care Management Important Message (Signed)
Important Message  Patient Details  Name: Tina Long MRN: [id number] Date of Birth: 05/30/1928   Medicare Important Message Given:  Yes-second notification given    Haskell Flirt 07/21/2015, 12:02 PMImportant Message  Patient Details  Name: Tina Long MRN: [id number] Date of Birth: 02/07/1928   Medicare Important Message Given:  Yes-second notification given    Haskell Flirt 07/21/2015, 12:02 PM

## 2015-07-24 ENCOUNTER — Non-Acute Institutional Stay (SKILLED_NURSING_FACILITY): Payer: Medicare Other | Admitting: Adult Health

## 2015-07-24 ENCOUNTER — Encounter: Payer: Self-pay | Admitting: Adult Health

## 2015-07-24 DIAGNOSIS — M1711 Unilateral primary osteoarthritis, right knee: Secondary | ICD-10-CM | POA: Diagnosis not present

## 2015-07-24 DIAGNOSIS — K5900 Constipation, unspecified: Secondary | ICD-10-CM

## 2015-07-24 DIAGNOSIS — M1990 Unspecified osteoarthritis, unspecified site: Secondary | ICD-10-CM | POA: Diagnosis not present

## 2015-07-24 DIAGNOSIS — I10 Essential (primary) hypertension: Secondary | ICD-10-CM | POA: Diagnosis not present

## 2015-07-24 DIAGNOSIS — D62 Acute posthemorrhagic anemia: Secondary | ICD-10-CM | POA: Diagnosis not present

## 2015-07-24 DIAGNOSIS — G4730 Sleep apnea, unspecified: Secondary | ICD-10-CM

## 2015-07-27 ENCOUNTER — Non-Acute Institutional Stay (SKILLED_NURSING_FACILITY): Payer: Medicare Other | Admitting: Internal Medicine

## 2015-07-27 DIAGNOSIS — D72829 Elevated white blood cell count, unspecified: Secondary | ICD-10-CM

## 2015-07-27 DIAGNOSIS — I10 Essential (primary) hypertension: Secondary | ICD-10-CM | POA: Diagnosis not present

## 2015-07-27 DIAGNOSIS — D62 Acute posthemorrhagic anemia: Secondary | ICD-10-CM | POA: Diagnosis not present

## 2015-07-27 DIAGNOSIS — R2681 Unsteadiness on feet: Secondary | ICD-10-CM | POA: Diagnosis not present

## 2015-07-27 DIAGNOSIS — M1711 Unilateral primary osteoarthritis, right knee: Secondary | ICD-10-CM

## 2015-07-27 DIAGNOSIS — K5901 Slow transit constipation: Secondary | ICD-10-CM

## 2015-07-27 NOTE — Progress Notes (Signed)
Patient ID: Tina Long, female   DOB: 1927/12/31, 81 y.o.   MRN: [id number]     [facility]   PCP: PROVIDER NOT IN SYSTEM  Code Status: full code  Allergies  Allergen Reactions  . Morphine And Related Itching  . Tramadol Itching    Sedation, voice changed.     Chief Complaint  Patient presents with  . New Admit To SNF     HPI:  81 y.o. patient is here for short term rehabilitation post hospital admission from 07/19/15-07/21/15 with right knee OA. She underwent right total knee arthroplasty. She is seen in her room today. She has not been getting her pain medication at exact time and she would like it scheduled. She has been constipated. Reviewed her facility admission lab which shows her hemoglobin level to have dropped down further. Denies any melena or rectal bleed. Denies heartburn or reflux. No other concerns. Her daughter is at bedside and is concerned about her mother's knee dressing not being changed on daily basis. She is also concerned about her urine being dark in color but pt denies any burning pain or discomfort with urination.  Review of Systems:  Constitutional: Negative for fever, chills, diaphoresis.  HENT: Negative for headache, congestion, nasal discharge  Eyes: Negative for eye pain, blurred vision, double vision and discharge.  Respiratory: Negative for cough, shortness of breath and wheezing.   Cardiovascular: Negative for chest pain, palpitations, leg swelling.  Gastrointestinal: Negative for heartburn, nausea, vomiting, abdominal pain Genitourinary: Negative for dysuria, flank pain.  Musculoskeletal: Negative for back pain, falls Skin: Negative for itching, rash.  Neurological: Negative for dizziness, tingling, focal weakness Psychiatric/Behavioral: Negative for depression   Past Medical History  Diagnosis Date  . Hypertension   . Arthritis   . Sleep apnea     cpap   . Diabetes mellitus without complication    borderline   . Urinary urgency   . GERD (gastroesophageal reflux disease)     hx of    Past Surgical History  Procedure Laterality Date  . Abdominal hysterectomy    . Appendectomy    . Eye surgery      bilateral cataracts with lens implants  . Total knee arthroplasty Right 07/19/2015    Procedure: RIGHT TOTAL KNEE ARTHROPLASTY;  Surgeon: Ollen Gross, MD;  Location: [facility];  Service: Orthopedics;  Laterality: Right;   Social History:   reports that she has never smoked. She has never used smokeless tobacco. She reports that she does not drink alcohol or use illicit drugs.  No family history on file.  Medications:   Medication List       This list is accurate as of: 07/27/15 12:31 PM.  Always use your most recent med list.               docusate sodium 100 MG capsule  Commonly known as:  COLACE  Take 1 capsule (100 mg total) by mouth 2 (two) times daily.     HYDROcodone-acetaminophen 5-325 MG per tablet  Commonly known as:  NORCO/VICODIN  Take 1 tablet by mouth every 4 (four) hours as needed for moderate pain.     losartan-hydrochlorothiazide 100-25 MG per tablet  Commonly known as:  HYZAAR  Take 1 tablet by mouth every morning.     methocarbamol 500 MG tablet  Commonly known as:  ROBAXIN  Take 1 tablet (500 mg total) by mouth every 6 (six) hours as needed for muscle spasms.  oxyCODONE 5 MG immediate release tablet  Commonly known as:  Oxy IR/ROXICODONE  Take 1-2 tablets (5-10 mg total) by mouth every 3 (three) hours as needed for breakthrough pain.     predniSONE 5 MG tablet  Commonly known as:  DELTASONE  Take 5 mg by mouth daily as needed (flares/ mobility.).     rivaroxaban 10 MG Tabs tablet  Commonly known as:  XARELTO  Take 1 tablet (10 mg total) by mouth daily with breakfast.     UNABLE TO FIND  C-Pap         Physical Exam: Filed Vitals:   07/27/15 1230  BP: 120/60  Pulse: 88  Temp: 96 F (35.6 C)  Resp: 18  SpO2: 99%    General-  elderly female, obese, in no acute distress Head- normocephalic, atraumatic Nose- normal nasal mucosa, no maxillary or frontal sinus tenderness, no nasal discharge Throat- moist mucus membrane  Eyes- PERRLA, EOMI, no pallor, no icterus, no discharge, normal conjunctiva, normal sclera Neck- no cervical lymphadenopathy Cardiovascular- normal s1,s2, no murmurs, palpable dorsalis pedis and radial pulses, 1+ right leg edema Respiratory- bilateral clear to auscultation, no wheeze, no rhonchi, no crackles, no use of accessory muscles Abdomen- bowel sounds present, soft, non tender Musculoskeletal- able to move all 4 extremities, right leg limited range of motion  Neurological- no focal deficit, alert and oriented  Skin- warm and dry, right knee surgical incision with steri strip and healing well, dressing clean and dry Psychiatry- normal mood and affect    Labs reviewed: Basic Metabolic Panel:  Recent Labs  16/10/96 1400 07/20/15 0450 07/21/15 0549  NA 136 135 136  K 3.6 4.2 4.3  CL 101 102 101  CO2 GLUCOSE 108* 185* 126*  BUN 23* 17 17  CREATININE 1.02* 1.01* 0.98  CALCIUM 9.5 9.0 9.2   Liver Function Tests:  Recent Labs  04/17/15 1415 07/17/15 1400  AST 18 18  ALT 15 11*  ALKPHOS 56 64  BILITOT 0.5 0.6  PROT 7.7 7.4  ALBUMIN 4.0 4.0   No results for input(s): LIPASE, AMYLASE in the last 8760 hours. No results for input(s): AMMONIA in the last 8760 hours. CBC:  Recent Labs  07/17/15 1400 07/20/15 0450 07/21/15 0549  WBC 8.6 10.2 14.0*  HGB 12.5 11.1* 10.2*  HCT 39.0 35.5* 32.2*  MCV 86.9 84.7 85.2  PLT 288 266 285   07/25/15 wbc 9.9, hb 9.6, hct 29.2, plt 306, na 134, k 4.1, glu 115, bun 17, cr 0.86, ca 8.7   Assessment/Plan  Unsteady gait Will have patient work with PT/OT as tolerated to regain strength and restore function.  Fall precautions are in place.  Right knee OA S/p right TKA. Will have her work with physical therapy and occupational  therapy team to help with gait training and muscle strengthening exercises.fall precautions. Skin care. Encourage to be out of bed. Currently on hydrocodone apap 5-325 1 tab q4h prn pain with oxycodone 5 mg po q4h prn breakthrough pain and robaxin 500 mg qhs and q6h prn muscle spasm. Continue xarelto for dvt prophylaxis. Change hydrocodone apap to 5325 mg scheduled q6h for pain and reassess. Had follow up with orthopedics. To wear ted hose. Daily dressing change.  Acute blood loss anemia Monitor h&h, drop in hb from time of discharge noted. Currently on xarelto. Start ferrous sulfate 325 mg daily for now, check cbc in 1 week. Guaiac stool x 3 with drop in Hb   Leukocytosis On  chronic prednisone which could be contributing to this along with some reactive leukocytosis. Repeat check has normal wbc count. Monitor clinically  HTN Stable reading, continue hyzaar 100-25 mg daily  Constipation On colace 100 mg bid. Not helping, d/c colace. Start senna s 2 tab daily with miralax daily for now, hydration encouraged    Goals of care: short term rehabilitation   Labs/tests ordered: cbc in 1 week  Family/ staff Communication: reviewed care plan with patient and nursing supervisor    Oneal Grout, MD  [facility] [phone] (Monday-Friday 8 am - 5 pm) [phone] (afterhours)

## 2015-08-04 ENCOUNTER — Non-Acute Institutional Stay (SKILLED_NURSING_FACILITY): Payer: Medicare Other | Admitting: Adult Health

## 2015-08-04 ENCOUNTER — Encounter: Payer: Self-pay | Admitting: Adult Health

## 2015-08-04 DIAGNOSIS — K5901 Slow transit constipation: Secondary | ICD-10-CM | POA: Diagnosis not present

## 2015-08-04 DIAGNOSIS — I10 Essential (primary) hypertension: Secondary | ICD-10-CM

## 2015-08-04 DIAGNOSIS — M1711 Unilateral primary osteoarthritis, right knee: Secondary | ICD-10-CM | POA: Diagnosis not present

## 2015-08-04 DIAGNOSIS — G4730 Sleep apnea, unspecified: Secondary | ICD-10-CM | POA: Diagnosis not present

## 2015-08-04 DIAGNOSIS — M1990 Unspecified osteoarthritis, unspecified site: Secondary | ICD-10-CM

## 2015-08-04 DIAGNOSIS — D62 Acute posthemorrhagic anemia: Secondary | ICD-10-CM

## 2015-08-06 NOTE — Progress Notes (Signed)
Patient ID: Tina Long, female   DOB: Jul 29, 1928, 81 y.o.   MRN: [id number]    DATE:  902/16 MRN:  [id number]  BIRTHDAY: [birth year]  Facility:  Nursing Home Location:  [facility]  Nursing Home Room Number: 101-P  LEVEL OF CARE:  SNF (31)      Contact Information    Name Relation Home Work [place] Daughter [phone]     Vaughan Sine   [phone]   Diaz,Gaynelle Daughter   [phone]      Chief Complaint  Patient presents with  . Discharge Note    HISTORY OF PRESENT ILLNESS:  This is an 81 year old female who is for discharge home with outpatient care. DME:  Standard rolling walker and bedside commode.  She has been admitted to [facility] on 07/21/15 from [facility]. She has PMH of hypertension, arthritis, sleep apnea on CPAP, diabetes mellitus, urinary urgency and GERD. She has osteoarthritis of right knee for which she had right total knee arthroplasty on 07/19/15.  Patient was admitted to this facility for short-term rehabilitation after the patient's recent hospitalization.  Patient has completed SNF rehabilitation and therapy has cleared the patient for discharge.  PAST MEDICAL HISTORY:  Past Medical History  Diagnosis Date  . Hypertension   . Arthritis   . Sleep apnea     cpap   . Diabetes mellitus without complication     borderline   . Urinary urgency   . GERD (gastroesophageal reflux disease)     hx of     CURRENT MEDICATIONS: Reviewed     Medication List       This list is accurate as of: 08/04/15 11:59 PM.  Always use your most recent med list.               docusate sodium 100 MG capsule  Commonly known as:  COLACE  Take 1 capsule (100 mg total) by mouth 2 (two) times daily.     HYDROcodone-acetaminophen 5-325 MG per tablet  Commonly known as:  NORCO/VICODIN  Take 1 tablet by mouth every 4 (four) hours as needed for moderate pain.     losartan-hydrochlorothiazide 100-25 MG per tablet    Commonly known as:  HYZAAR  Take 1 tablet by mouth every morning.     methocarbamol 500 MG tablet  Commonly known as:  ROBAXIN  Take 500 mg by mouth at bedtime.     ROBAXIN PO  Take 250 mg by mouth every 6 (six) hours as needed. 1/2 tab of 500 mg/tab = 250 mg Q 6 hours PRN     oxycodone 5 MG capsule  Commonly known as:  OXY-IR  Take 5 mg by mouth every 4 (four) hours as needed.     predniSONE 5 MG tablet  Commonly known as:  DELTASONE  Take 5 mg by mouth daily as needed (flares/ mobility.).     rivaroxaban 10 MG Tabs tablet  Commonly known as:  XARELTO  Take 1 tablet (10 mg total) by mouth daily with breakfast.     UNABLE TO FIND  C-Pap          Allergies  Allergen Reactions  . Morphine And Related Itching  . Tramadol Itching    Sedation, voice changed.      REVIEW OF SYSTEMS:  GENERAL: no change in appetite, no fatigue, no weight changes, no fever, chills or weakness EYES: Denies change in vision, dry eyes, eye pain, itching or discharge EARS:  Denies change in hearing, ringing in ears, or earache NOSE: Denies nasal congestion or epistaxis MOUTH and THROAT: Denies oral discomfort, gingival pain or bleeding, pain from teeth or hoarseness   RESPIRATORY: no cough, SOB, DOE, wheezing, hemoptysis CARDIAC: no chest pain, edema or palpitations GI: no abdominal pain, diarrhea, constipation, heart burn, nausea or vomiting GU: Denies dysuria, frequency, hematuria, incontinence, or discharge PSYCHIATRIC: Denies feeling of depression or anxiety. No report of hallucinations, insomnia, paranoia, or agitation   PHYSICAL EXAMINATION  GENERAL APPEARANCE: Well nourished. In no acute distress. Obese SKIN:  Right knee surgical wound is dry,   no erythema HEAD: Normal in size and contour. No evidence of trauma EYES: Lids open and close normally. No blepharitis, entropion or ectropion. PERRL. Conjunctivae are clear and sclerae are white. Lenses are without opacity EARS: Pinnae  are normal. Patient hears normal voice tunes of the examiner MOUTH and THROAT: Lips are without lesions. Oral mucosa is moist and without lesions. Tongue is normal in shape, size, and color and without lesions NECK: supple, trachea midline, no neck masses, no thyroid tenderness, no thyromegaly LYMPHATICS: no LAN in the neck, no supraclavicular LAN RESPIRATORY: breathing is even & unlabored, BS CTAB CARDIAC: RRR, no murmur,no extra heart sounds, no edema GI: abdomen soft, normal BS, no masses, no tenderness, no hepatomegaly, no splenomegaly EXTREMITIES:  Able to move 4 extremities PSYCHIATRIC: Alert and oriented X 3. Affect and behavior are appropriate  LABS/RADIOLOGY: Labs reviewed: 07/25/15  WBC 9.9 hemoglobin 9.6 hematocrit 29.2 MCV 82.7 platelet 306 sodium 134 potassium 4.1 glucose 115 BUN 17 creatinine 0.86 calcium 8.7 Basic Metabolic Panel:  Recent Labs  16/10/96 1400 07/20/15 0450 07/21/15 0549  NA 136 135 136  K 3.6 4.2 4.3  CL 101 102 101  CO2 28 24 27   GLUCOSE 108* 185* 126*  BUN 23* 17 17  CREATININE 1.02* 1.01* 0.98  CALCIUM 9.5 9.0 9.2   Liver Function Tests:  Recent Labs  04/17/15 1415 07/17/15 1400  AST 18 18  ALT 15 11*  ALKPHOS 56 64  BILITOT 0.5 0.6  PROT 7.7 7.4  ALBUMIN 4.0 4.0   CBC:  Recent Labs  07/17/15 1400 07/20/15 0450 07/21/15 0549  WBC 8.6 10.2 14.0*  HGB 12.5 11.1* 10.2*  HCT 39.0 35.5* 32.2*  MCV 86.9 84.7 85.2  PLT 288 266 285   CBG:  Recent Labs  07/19/15 1245  GLUCAP 93     ASSESSMENT/PLAN:  Osteoarthritis S/P right total knee arthroplasty - for rehabilitation; RLE WBAT; continue Norco 5/325 mg 1 tab by mouth every 4 hours when necessary and oxycodone 5 mg 1 tab by mouth 3 times a day when necessary for pain;  Robaxin 500 mg 1 tab by mouth daily at bedtime and Robaxin 500 mg 1/2 tab = 250 mg by mouth every 6 hours when necessary for muscle spasm; Xarelto 10 mg 1 tab by mouth daily for DVT prophylaxis; follow-up with  Dr. Lequita Halt, orthopedic surgeon  Constipation - continue Colace 100 mg by mouth twice a day  Hypertension - continue Hyzaar 100-25 mg 1 tab by mouth every morning  Arthritis - continue prednisone 5 mg 1 tab by mouth daily when necessary  Sleep apnea - continue CPAP  Anemia, acute blood loss - hemoglobin 9.6; stable     I have filled out patient's discharge paperwork and written prescriptions.  Patient will have outpatient rehabilitation.  DME provided: Standard rolling walker and bedside commode  Total discharge time: Greater than 30 minutes  Discharge time involved coordination of the discharge process with social worker, nursing staff and therapy department. Medical justification for DME verified.    [facility], NP [employer] [phone]

## 2015-08-06 NOTE — Progress Notes (Addendum)
Patient ID: Tina Long, female   DOB: October 26, 1928, 81 y.o.   MRN: [id number]    DATE:  07/24/15 MRN:  [id number]  BIRTHDAY: 1928/07/30  Facility:  Nursing Home Location:  [facility]  Nursing Home Room Number: 101-P  LEVEL OF CARE:  SNF (31)  Contact Information    Name Relation Home Work [place] Daughter [phone]     Vaughan Sine   [phone]   Diaz,Gaynelle Daughter   [phone]      Chief Complaint  Patient presents with  . Hospitalization Follow-up    Osteoarthritis S/P right total knee arthroplasty, hypertension, constipation, arthritis, sleep apnea and anemia    HISTORY OF PRESENT ILLNESS:  This is an 81 year old female who was been admitted to [facility] on 07/21/15 from [facility]. She has PMH of hypertension, arthritis, sleep apnea on CPAP, diabetes mellitus, urinary urgency and GERD. She has osteoarthritis of right knee for which she had right total knee arthroplasty on 07/19/15.  She has been admitted for a short-term rehabilitation.    PAST MEDICAL HISTORY:  Past Medical History  Diagnosis Date  . Hypertension   . Arthritis   . Sleep apnea     cpap   . Diabetes mellitus without complication     borderline   . Urinary urgency   . GERD (gastroesophageal reflux disease)     hx of     CURRENT MEDICATIONS: Reviewed     Medication List       This list is accurate as of: 07/24/15 11:59 PM.  Always use your most recent med list.               docusate sodium 100 MG capsule  Commonly known as:  COLACE  Take 1 capsule (100 mg total) by mouth 2 (two) times daily.     HYDROcodone-acetaminophen 5-325 MG per tablet  Commonly known as:  NORCO/VICODIN  Take 1 tablet by mouth every 4 (four) hours as needed for moderate pain.     losartan-hydrochlorothiazide 100-25 MG per tablet  Commonly known as:  HYZAAR  Take 1 tablet by mouth every morning.     methocarbamol 500 MG tablet  Commonly known as:   ROBAXIN  Take 500 mg by mouth at bedtime.     ROBAXIN PO  Take 250 mg by mouth every 6 (six) hours as needed. 1/2 tab of 500 mg/tab = 250 mg Q 6 hours PRN     oxycodone 5 MG capsule  Commonly known as:  OXY-IR  Take 5 mg by mouth every 4 (four) hours as needed.     predniSONE 5 MG tablet  Commonly known as:  DELTASONE  Take 5 mg by mouth daily as needed (flares/ mobility.).     rivaroxaban 10 MG Tabs tablet  Commonly known as:  XARELTO  Take 1 tablet (10 mg total) by mouth daily with breakfast.     UNABLE TO FIND  C-Pap          Allergies  Allergen Reactions  . Morphine And Related Itching  . Tramadol Itching    Sedation, voice changed.      REVIEW OF SYSTEMS:  GENERAL: no change in appetite, no fatigue, no weight changes, no fever, chills or weakness SKIN: Denies rash, itching, wounds, ulcer sores, or nail abnormality EYES: Denies change in vision, dry eyes, eye pain, itching or discharge EARS: Denies change in hearing, ringing in ears, or earache NOSE: Denies nasal congestion or  epistaxis MOUTH and THROAT: Denies oral discomfort, gingival pain or bleeding, pain from teeth or hoarseness   RESPIRATORY: no cough, SOB, DOE, wheezing, hemoptysis CARDIAC: no chest pain, edema or palpitations GI: no abdominal pain, diarrhea, constipation, heart burn, nausea or vomiting GU: Denies dysuria, frequency, hematuria, incontinence, or discharge PSYCHIATRIC: Denies feeling of depression or anxiety. No report of hallucinations, insomnia, paranoia, or agitation   PHYSICAL EXAMINATION  GENERAL APPEARANCE: Well nourished. In no acute distress. Obese SKIN:  Right knee surgical wound has steri-strips and dry dressing,  no erythema HEAD: Normal in size and contour. No evidence of trauma EYES: Lids open and close normally. No blepharitis, entropion or ectropion. PERRL. Conjunctivae are clear and sclerae are white. Lenses are without opacity EARS: Pinnae are normal. Patient hears  normal voice tunes of the examiner MOUTH and THROAT: Lips are without lesions. Oral mucosa is moist and without lesions. Tongue is normal in shape, size, and color and without lesions NECK: supple, trachea midline, no neck masses, no thyroid tenderness, no thyromegaly LYMPHATICS: no LAN in the neck, no supraclavicular LAN RESPIRATORY: breathing is even & unlabored, BS CTAB CARDIAC: RRR, no murmur,no extra heart sounds, no edema GI: abdomen soft, normal BS, no masses, no tenderness, no hepatomegaly, no splenomegaly EXTREMITIES:  Able to move 4 extremities PSYCHIATRIC: Alert and oriented X 3. Affect and behavior are appropriate  LABS/RADIOLOGY: Labs reviewed: Basic Metabolic Panel:  Recent Labs  16/10/96 1400 07/20/15 0450 07/21/15 0549  NA 136 135 136  K 3.6 4.2 4.3  CL 101 102 101  CO2 GLUCOSE 108* 185* 126*  BUN 23* 17 17  CREATININE 1.02* 1.01* 0.98  CALCIUM 9.5 9.0 9.2   Liver Function Tests:  Recent Labs  04/17/15 1415 07/17/15 1400  AST 18 18  ALT 15 11*  ALKPHOS 56 64  BILITOT 0.5 0.6  PROT 7.7 7.4  ALBUMIN 4.0 4.0   CBC:  Recent Labs  07/17/15 1400 07/20/15 0450 07/21/15 0549  WBC 8.6 10.2 14.0*  HGB 12.5 11.1* 10.2*  HCT 39.0 35.5* 32.2*  MCV 86.9 84.7 85.2  PLT 288 266 285   CBG:  Recent Labs  07/19/15 1245  GLUCAP 93     ASSESSMENT/PLAN:  Osteoarthritis S/P right total knee arthroplasty - for rehabilitation; RLE WBAT; continue Norco 5/325 mg 1 tab by mouth every 4 hours when necessary and oxycodone 5 mg 1 tab by mouth 3 times a day when necessary for pain; change Robaxin 500 mg 1 tab by mouth daily at bedtime and Robaxin 500 mg 1/2 tab = 250 mg by mouth every 6 hours when necessary for muscle spasm; Xarelto 10 mg 1 tab by mouth daily for DVT prophylaxis; follow-up with Dr. Lequita Halt, orthopedic surgeon, on 08/03/15  Constipation - continue Colace 100 mg by mouth twice a day  Hypertension - continue Hyzaar 100-25 mg 1 tab by mouth  every morning; check BMP  Arthritis - continue prednisone 5 mg 1 tab by mouth daily when necessary  Sleep apnea - continue CPAP  Anemia, acute blood loss - hemoglobin 10.2; check CBC    Goals of care:  Short-term rehabilitation    [employer], NP [facility] [phone]

## 2019-03-16 ENCOUNTER — Other Ambulatory Visit: Payer: Self-pay

## 2019-03-16 ENCOUNTER — Emergency Department
Admission: EM | Admit: 2019-03-16 | Discharge: 2019-03-16 | Disposition: A | Discharge status: Home / Self Care | Payer: Medicare Other | Source: Home / Self Care

## 2019-03-16 ENCOUNTER — Emergency Department (INDEPENDENT_AMBULATORY_CARE_PROVIDER_SITE_OTHER): Payer: Medicare Other

## 2019-03-16 ENCOUNTER — Emergency Department: Payer: Medicare Other

## 2019-03-16 ENCOUNTER — Emergency Department (HOSPITAL_COMMUNITY): Payer: Medicare Other

## 2019-03-16 ENCOUNTER — Telehealth: Payer: Self-pay | Admitting: Emergency Medicine

## 2019-03-16 ENCOUNTER — Emergency Department (HOSPITAL_COMMUNITY)
Admission: EM | Admit: 2019-03-16 | Discharge: 2019-03-16 | Disposition: A | Discharge status: Home / Self Care | Payer: Medicare Other | Attending: Emergency Medicine | Admitting: Emergency Medicine

## 2019-03-16 ENCOUNTER — Encounter (HOSPITAL_COMMUNITY): Payer: Self-pay | Admitting: Emergency Medicine

## 2019-03-16 DIAGNOSIS — R0600 Dyspnea, unspecified: Secondary | ICD-10-CM | POA: Diagnosis present

## 2019-03-16 DIAGNOSIS — Z7901 Long term (current) use of anticoagulants: Secondary | ICD-10-CM | POA: Insufficient documentation

## 2019-03-16 DIAGNOSIS — Z96651 Presence of right artificial knee joint: Secondary | ICD-10-CM | POA: Diagnosis not present

## 2019-03-16 DIAGNOSIS — I700 Atherosclerosis of aorta: Secondary | ICD-10-CM | POA: Diagnosis not present

## 2019-03-16 DIAGNOSIS — R002 Palpitations: Secondary | ICD-10-CM

## 2019-03-16 DIAGNOSIS — R0602 Shortness of breath: Secondary | ICD-10-CM

## 2019-03-16 DIAGNOSIS — R2242 Localized swelling, mass and lump, left lower limb: Secondary | ICD-10-CM

## 2019-03-16 DIAGNOSIS — Z79899 Other long term (current) drug therapy: Secondary | ICD-10-CM | POA: Diagnosis not present

## 2019-03-16 DIAGNOSIS — M79674 Pain in right toe(s): Secondary | ICD-10-CM | POA: Diagnosis not present

## 2019-03-16 DIAGNOSIS — L84 Corns and callosities: Secondary | ICD-10-CM | POA: Diagnosis not present

## 2019-03-16 DIAGNOSIS — I10 Essential (primary) hypertension: Secondary | ICD-10-CM

## 2019-03-16 LAB — POCT CBC W AUTO DIFF (K'VILLE URGENT CARE)

## 2019-03-16 LAB — TIQ-NTM

## 2019-03-16 LAB — TROPONIN I: Troponin I: <0.03 ng/mL (ref <0.03)

## 2019-03-16 LAB — BRAIN NATRIURETIC PEPTIDE: B Natriuretic Peptide: 204.6 pg/mL — ABNORMAL HIGH (ref 0.0–100.0)

## 2019-03-16 MED ORDER — FUROSEMIDE 20 MG PO TABS: 20.0000 mg | ORAL_TABLET | Freq: Every day | ORAL | 0 refills | Status: DC

## 2019-03-16 MED ORDER — IOHEXOL 350 MG/ML SOLN
100.0000 mL | Freq: Once | INTRAVENOUS | Status: AC | PRN
  Administered 2019-03-16: 22:00:00 100 mL via INTRAVENOUS

## 2019-03-16 NOTE — Telephone Encounter (Signed)
Consulted with Dr. Elberta Fortis, cardiologist, to potentially schedule a f/u with [facility].  He recommended pt try trial of lasix 20mg  once daily for 3 days. F/u with her PCP.   Also discussed pt with Dr. Cathren Harsh. Lab was unable to add BNP to today's blood sample. Pt will return tomorrow morning for repeat EKG and to send of BNP.  Pt and daughter notified/agreed with tx plan.

## 2019-03-16 NOTE — Discharge Instructions (Signed)
You were seen in the emergency department today for an abnormal lab test outpatient.  Your CT scan does not show a blood clot.  Your CT scan does show that your heart is mildly enlarged as was seen on your chest x-ray earlier today.  Your BNP, a lab that we look up for heart failure was very mildly elevated consistent with the chest x-ray changes.  Given these findings we would like you to follow-up with a cardiologist.  Please fill the prescription for lasix that was written at urgent care. This is ready to be picked up @ [facility].  [facility] [address], [place] - [address]  Please take this medication as prescribed.  We have prescribed you new medication(s) today. Discuss the medications prescribed today with your pharmacist as they can have adverse effects and interactions with your other medicines including over the counter and prescribed medications. Seek medical evaluation if you start to experience new or abnormal symptoms after taking one of these medicines, seek care immediately if you start to experience difficulty breathing, feeling of your throat closing, facial swelling, or rash as these could be indications of a more serious allergic reaction  Please follow-up with cardiology within 3 days, we have given you contact information for cardiology office in the area.  Return to the emergency department immediately for new or worsening symptoms including but not limited to worsening trouble breathing, chest pain, fever, inability to keep fluids down, passing out, dizziness, lightheadedness, or any other concerns that you may have.

## 2019-03-16 NOTE — ED Provider Notes (Signed)
[facility]    CSN: [id number] Arrival date & time: 03/16/19  [id number]     History   Chief Complaint Chief Complaint  Patient presents with   Shortness of Breath   Toe Pain    HPI Tina ArnoldOmica D Long is a 85 y.o. female.   HPI Tina Long is a 85 y.o. female presenting to UC with c/o SOB that started about 1 month ago. SOB is new for pt, occurs while ambulating and is only able to ambulate a short distance before needing to take a rest. Associated palpitations with the SOB.  Denies chest pain.  Denies chest tightness, cough, congestion, fever, chills, n/v/d. No hx of heart problems that she knows of.  She also reports intermittent lower leg edema, she believes for a month as well, however, pt's daughter notes the leg swelling has occurred in the past as well.   [place] She last saw her PCP at the end of January 2020.  No recent medication changes. Denies hx of blood clots. Denies leg pain or unilateral leg swelling. No recent travel or surgery.  She was on Xarelto in the past per her PMH, however, it appears she was only on temporarily secondary to having a Right total knee arthroplasty which required her to rehab in a nursing home.  Pt also c/o several weeks of a painful corn/callus on her Right little toe. Per her daughter, she has picked at it some but no bleeding or drainage. She has not tried any medication or bandage on the area.    Past Medical History:  Diagnosis Date   Arthritis    Diabetes mellitus without complication (HCC)    borderline    GERD (gastroesophageal reflux disease)    hx of    Hypertension    Sleep apnea    cpap    Urinary urgency     Patient Active Problem List   Diagnosis Date Noted   OA (osteoarthritis) of knee 07/19/2015    Past Surgical History:  Procedure Laterality Date   ABDOMINAL HYSTERECTOMY     APPENDECTOMY     EYE SURGERY     bilateral cataracts with lens implants   TOTAL KNEE  ARTHROPLASTY Right 07/19/2015   Procedure: RIGHT TOTAL KNEE ARTHROPLASTY;  Surgeon: Ollen GrossFrank Aluisio, MD;  Location: [facility];  Service: Orthopedics;  Laterality: Right;    OB History   No obstetric history on file.      Home Medications    Prior to Admission medications   Medication Sig Start Date End Date Taking? Authorizing Provider  hydrochlorothiazide (HYDRODIURIL) 25 MG tablet  03/20/18  Yes [provider]  docusate sodium (COLACE) 100 MG capsule Take 1 capsule (100 mg total) by mouth 2 (two) times daily. 07/20/15   Constable, Amber, PA-C  furosemide (LASIX) 20 MG tablet Take 1 tablet (20 mg total) by mouth daily for 3 days. 03/16/19 03/19/19  Lurene ShadowPhelps, Doninique Lwin O, PA-C  HYDROcodone-acetaminophen (NORCO/VICODIN) 5-325 MG per tablet Take 1 tablet by mouth every 4 (four) hours as needed for moderate pain. 07/20/15   Constable, Amber, PA-C  losartan (COZAAR) 100 MG tablet TAKE 1 TABLET BY MOUTH ONCE DAILY FOR BLOOD PRESSURE 02/10/19   [provider]  losartan-hydrochlorothiazide (HYZAAR) 100-25 MG per tablet Take 1 tablet by mouth every morning.    [provider]  Methocarbamol (ROBAXIN PO) Take 250 mg by mouth every 6 (six) hours as needed. 1/2 tab of 500 mg/tab = 250 mg  Q 6 hours PRN    [provider]  methocarbamol (ROBAXIN) 500 MG tablet Take 500 mg by mouth at bedtime.    [provider]  metoprolol tartrate (LOPRESSOR) 25 MG tablet TAKE 1 2 (ONE HALF) TABLET BY MOUTH TWICE DAILY 02/01/19   [provider]  oxycodone (OXY-IR) 5 MG capsule Take 5 mg by mouth every 4 (four) hours as needed.    [provider]  predniSONE (DELTASONE) 5 MG tablet Take 5 mg by mouth daily as needed (flares/ mobility.).     [provider]  rivaroxaban (XARELTO) 10 MG TABS tablet Take 1 tablet (10 mg total) by mouth daily with breakfast. 07/20/15   Dimitri Ped, PA-C  UNABLE TO FIND C-Pap    [provider]    Family History History  reviewed. No pertinent family history.  Social History Social History   Tobacco Use   Smoking status: Never Smoker   Smokeless tobacco: Never Used  Substance Use Topics   Alcohol use: No   Drug use: No     Allergies   Morphine and related and Tramadol   Review of Systems Review of Systems  Constitutional: Negative for chills and fever.  HENT: Negative for congestion, ear pain, sore throat, trouble swallowing and voice change.   Respiratory: Positive for shortness of breath. Negative for cough.   Cardiovascular: Positive for palpitations and leg swelling. Negative for chest pain.  Gastrointestinal: Negative for abdominal pain, diarrhea, nausea and vomiting.  Musculoskeletal: Negative for arthralgias, back pain and myalgias.  Skin: Negative for rash.       Right skin callus      Physical Exam Triage Vital Signs ED Triage Vitals  Enc Vitals Group     BP 03/16/19 0917 (!) 157/73     Pulse Rate 03/16/19 0917 62     Resp 03/16/19 0917 20     Temp 03/16/19 0917 98 F (36.7 C)     Temp Source 03/16/19 0917 Oral     SpO2 03/16/19 0917 97 %     Weight 03/16/19 0919 228 lb (103.4 kg)     Height 03/16/19 0919 5\' 5"  (1.651 m)     Head Circumference --      Peak Flow --      Pain Score 03/16/19 0918 8     Pain Loc --      Pain Edu? --      Excl. in GC? --    Orthostatic VS for the past 24 hrs:  BP- Lying Pulse- Lying BP- Sitting Pulse- Sitting BP- Standing at 0 minutes Pulse- Standing at 0 minutes  03/16/19 0952 151/72 58 150/83 58 156/86 59    Updated Vital Signs BP (!) 157/73 (BP Location: Right Arm)    Pulse 62    Temp 98 F (36.7 C) (Oral)    Resp 20    Ht 5\' 5"  (1.651 m)    Wt 228 lb (103.4 kg)    SpO2 97%    BMI 37.94 kg/m   Visual Acuity Right Eye Distance:   Left Eye Distance:   Bilateral Distance:    Right Eye Near:   Left Eye Near:    Bilateral Near:     Physical Exam Vitals signs and nursing note reviewed.  Constitutional:      Appearance: She  is well-developed. She is obese.     Comments: Pt sitting in exam chair, NAD.  HENT:     Head: Normocephalic and atraumatic.  Neck:  Musculoskeletal: Normal range of motion.  Cardiovascular:     Rate and Rhythm: Normal rate and regular rhythm.  Pulmonary:     Effort: Pulmonary effort is normal. No tachypnea.     Breath sounds: Normal breath sounds. No decreased breath sounds, wheezing, rhonchi or rales.  Musculoskeletal: Normal range of motion.     Right lower leg: She exhibits no tenderness. Edema present.     Left lower leg: She exhibits no tenderness. Edema present.     Comments: Mild bilateral lower leg edema.  Skin:    General: Skin is warm and dry.     Capillary Refill: Capillary refill takes less than 2 seconds.     Comments: Right little toe: 2cm area thickened white callus skin. Mildly tender. No erythema or ecchymosis. No drainage.   Neurological:     Mental Status: She is alert and oriented to person, place, and time.  Psychiatric:        Mood and Affect: Mood normal.        Behavior: Behavior normal.      UC Treatments / Results  Labs (all labs ordered are listed, but only abnormal results are displayed) Labs Reviewed  COMPLETE METABOLIC PANEL WITH GFR - Abnormal; Notable for the following components:      Result Value   Glucose, Bld 117 (*)    Creat 1.10 (*)    GFR, Est Non African American 45 (*)    GFR, Est African American 52 (*)    All other components within normal limits  TROPONIN I  TIQ-NTM  D-DIMER, QUANTITATIVE (NOT AT [facility])  POCT CBC W AUTO DIFF ([facility])    EKG Date/Time:03/16/2019    09:30:07 Ventricular Rate: 59 PR Interval: 170 QRS Duration: 90 QT Interval: 418 QTC Calculation: 413 P-R-T axes: -26    -9    23 Text Interpretation: Sinus bradycardia with premature atrial complexes. Otherwise normal ECG   Radiology Dg Chest 2 View  Result Date: 03/16/2019 CLINICAL DATA:  Shortness of breath.  Hypertension. EXAM:  CHEST - 2 VIEW COMPARISON:  None. FINDINGS: Lungs are clear. Heart is slightly enlarged with pulmonary vascularity normal. No adenopathy. There is aortic atherosclerosis. There is degenerative change in the thoracic spine. IMPRESSION: Mild cardiac enlargement. No edema or consolidation. Aortic Atherosclerosis (ICD10-I70.0). Electronically Signed   By: Bretta Bang III M.D.   On: 03/16/2019 10:05   US Venous Img Lower Unilateral Left  Result Date: 03/16/2019 CLINICAL DATA:  85 year old female with nodule behind the knee EXAM: LEFT LOWER EXTREMITY VENOUS DOPPLER ULTRASOUND TECHNIQUE: Gray-scale sonography with graded compression, as well as color Doppler and duplex ultrasound were performed to evaluate the lower extremity deep venous systems from the level of the common femoral vein and including the common femoral, femoral, profunda femoral, popliteal and calf veins including the posterior tibial, peroneal and gastrocnemius veins when visible. The superficial great saphenous vein was also interrogated. Spectral Doppler was utilized to evaluate flow at rest and with distal augmentation maneuvers in the common femoral, femoral and popliteal veins. COMPARISON:  None. FINDINGS: Contralateral Common Femoral Vein: Respiratory phasicity is normal and symmetric with the symptomatic side. No evidence of thrombus. Normal compressibility. Common Femoral Vein: No evidence of thrombus. Normal compressibility, respiratory phasicity and response to augmentation. Saphenofemoral Junction: No evidence of thrombus. Normal compressibility and flow on color Doppler imaging. Profunda Femoral Vein: No evidence of thrombus. Normal compressibility and flow on color Doppler imaging. Femoral Vein: No evidence of thrombus. Normal compressibility, respiratory  phasicity and response to augmentation. Popliteal Vein: No evidence of thrombus. Normal compressibility, respiratory phasicity and response to augmentation. Calf Veins: No evidence  of thrombus. Normal compressibility and flow on color Doppler imaging. Superficial Great Saphenous Vein: No evidence of thrombus. Normal compressibility and flow on color Doppler imaging. Other Findings:  None. IMPRESSION: Sonographic survey of the left lower extremity negative for DVT Electronically Signed   By: Gilmer Mor D.O.   On: 03/16/2019 11:20    Procedures Procedures (including critical care time)  Medications Ordered in UC Medications - No data to display  Initial Impression / Assessment and Plan / UC Course  I have reviewed the triage vital signs and the nursing notes.  Pertinent labs & imaging results that were available during my care of the patient were reviewed by me and considered in my medical decision making (see chart for details).     Reviewed imaging with pt. CBC: minimal anemia, Hgb 11.3 (11.8-15.5) not significant enough to cause reported symptoms. otherwise unremarkable.  CMP slightly elevated Cr, otherwise unremarkable Troponin- Normal D-dimer still pending at 16:01, safe for pt to continue waiting on results as there is very low likelihood of PE at this time. Venous U/S was negative. O2 Sat on RA remained 97% or above even while ambulating in UC office, pt is not tachycardic or tachypnic.   Consulted with cardiology to schedule f/u visit for potential Holter monitor.  Dr. Elberta Fortis who reviewed pt's ECG and labs, recommended trial of Lasix  once daily for 3 days then f/u with her PCP rather than cardiology office appointment at this time.  Discussed pt with Dr. Cathren Harsh who would like BNP ordered (was unable to add to labs today) will have pt return in the morning, 03/17/2019 for repeat EKG and send off BNP.  Discussed symptoms that warrant emergent care in the ED to pt and her daughter, both agreeable with tx plan.   Final Clinical Impressions(s) / UC Diagnoses   Final diagnoses:  Shortness of breath on exertion  Mass of left lower leg  Callus of foot    Toe pain, right  Palpitations     Discharge Instructions      Please follow up with your primary care provider for ongoing healthcare needs and to discuss today's visit.  You may try to be evaluated using a Telehealth visit with one of our primary care providers if you are in the area for an extended amount of time.  A cardiology referral has been placed. They should try to contact you within the next 2-3 days. If you do not hear from their office by Friday, please give them a call to schedule a follow up appointment. They may call our office if additional information is needed.  Call 911 or go to the hospital if symptoms worsening- you develop chest pain, lightheadedness/dizziness, shortness of breath not improving with rest, or other new concerning symptoms develop.    ED Prescriptions    None     Controlled Substance Prescriptions [place] Controlled Substance Registry consulted? Not Applicable   Rolla Plate 03/16/19 1606

## 2019-03-16 NOTE — Discharge Instructions (Signed)
°  Please follow up with your primary care provider for ongoing healthcare needs and to discuss today's visit.  You may try to be evaluated using a Telehealth visit with one of our primary care providers if you are in the area for an extended amount of time.  A cardiology referral has been placed. They should try to contact you within the next 2-3 days. If you do not hear from their office by Friday, please give them a call to schedule a follow up appointment. They may call our office if additional information is needed.  Call 911 or go to the hospital if symptoms worsening- you develop chest pain, lightheadedness/dizziness, shortness of breath not improving with rest, or other new concerning symptoms develop.

## 2019-03-16 NOTE — ED Provider Notes (Signed)
[facility] EMERGENCY DEPARTMENT Provider Note   CSN: [id number] Arrival date & time: 03/16/19  1815  History   Chief Complaint Chief Complaint  Patient presents with  . Shortness of Breath    HPI Tina Long is a 85 y.o. female with a hx of GERD, HTN, DM, and sleep apnea with utilization of CPAP who presents to the ED at request of UC for abnormal lab today. Patient states she has had sxs of progressively worsening dyspnea for the past 1 month. She states that the dyspnea is primarily with exertion but can occur in the middle of the night as well. She notes associated palpitations & mild chest pressure at times. Has had some bilateral lower extremity swelling L>R. Denies fever, chills, cough, nausea, vomiting, diaphoresis, hemoptysis, recent surgery/trauma, recent long travel, hormone use, personal hx of cancer, or hx of DVT/PE.   Per chart review: Patient seen at UC earlier today for same- had work-up including basic labs, trop, EKG, LLE venous duplex, and CXR, overall reassuring, some mild cardiomegaly on CXR, no edema/consolidation. D-dimer sent off- positive- patient instructed to come to the ED.     HPI  Past Medical History:  Diagnosis Date  . Arthritis   . Diabetes mellitus without complication (HCC)    borderline   . GERD (gastroesophageal reflux disease)    hx of   . Hypertension   . Sleep apnea    cpap   . Urinary urgency     Patient Active Problem List   Diagnosis Date Noted  . OA (osteoarthritis) of knee 07/19/2015    Past Surgical History:  Procedure Laterality Date  . ABDOMINAL HYSTERECTOMY    . APPENDECTOMY    . EYE SURGERY     bilateral cataracts with lens implants  . TOTAL KNEE ARTHROPLASTY Right 07/19/2015   Procedure: RIGHT TOTAL KNEE ARTHROPLASTY;  Surgeon: Ollen Gross, MD;  Location: [facility];  Service: Orthopedics;  Laterality: Right;     OB History   No obstetric history on file.      Home Medications    Prior to  Admission medications   Medication Sig Start Date End Date Taking? Authorizing Provider  docusate sodium (COLACE) 100 MG capsule Take 1 capsule (100 mg total) by mouth 2 (two) times daily. 07/20/15   Constable, Amber, PA-C  furosemide (LASIX) 20 MG tablet Take 1 tablet (20 mg total) by mouth daily for 3 days. 03/16/19 03/19/19  Lurene Shadow, PA-C  hydrochlorothiazide (HYDRODIURIL) 25 MG tablet  03/20/18   [provider]  HYDROcodone-acetaminophen (NORCO/VICODIN) 5-325 MG per tablet Take 1 tablet by mouth every 4 (four) hours as needed for moderate pain. 07/20/15   Constable, Amber, PA-C  losartan (COZAAR) 100 MG tablet TAKE 1 TABLET BY MOUTH ONCE DAILY FOR BLOOD PRESSURE 02/10/19   [provider]  losartan-hydrochlorothiazide (HYZAAR) 100-25 MG per tablet Take 1 tablet by mouth every morning.    [provider]  Methocarbamol (ROBAXIN PO) Take 250 mg by mouth every 6 (six) hours as needed. 1/2 tab of 500 mg/tab = 250 mg Q 6 hours PRN    [provider]  methocarbamol (ROBAXIN) 500 MG tablet Take 500 mg by mouth at bedtime.    [provider]  metoprolol tartrate (LOPRESSOR) 25 MG tablet TAKE 1 2 (ONE HALF) TABLET BY MOUTH TWICE DAILY 02/01/19   [provider]  oxycodone (OXY-IR) 5 MG capsule Take 5 mg by mouth every 4 (four) hours as needed.  [provider]  predniSONE (DELTASONE) 5 MG tablet Take 5 mg by mouth daily as needed (flares/ mobility.).     [provider]  rivaroxaban (XARELTO) 10 MG TABS tablet Take 1 tablet (10 mg total) by mouth daily with breakfast. 07/20/15   Dimitri Pedonstable, Amber, PA-C  UNABLE TO FIND C-Pap    [provider]    Family History History reviewed. No pertinent family history.  Social History Social History   Tobacco Use  . Smoking status: Never Smoker  . Smokeless tobacco: Never Used  Substance Use Topics  . Alcohol use: No  . Drug use: No     Allergies   Morphine and related  and Tramadol   Review of Systems Review of Systems  Constitutional: Negative for chills and fever.  Respiratory: Positive for shortness of breath. Negative for cough.   Cardiovascular: Positive for chest pain, palpitations and leg swelling.  Gastrointestinal: Negative for abdominal pain, nausea and vomiting.  Genitourinary: Negative for dysuria.  Neurological: Negative for dizziness, syncope, weakness, light-headedness and numbness.  All other systems reviewed and are negative.    Physical Exam Updated Vital Signs BP (!) 155/85 (BP Location: Left Arm)   Pulse 70   Temp 97.7 F (36.5 C) (Oral)   Resp 16   Ht 5\' 5"  (1.651 m)   Wt 107 kg   SpO2 100%   BMI 39.27 kg/m   Physical Exam Vitals signs and nursing note reviewed.  Constitutional:      General: She is not in acute distress.    Appearance: She is well-developed. She is not toxic-appearing.  HENT:     Head: Normocephalic and atraumatic.  Eyes:     General:        Right eye: No discharge.        Left eye: No discharge.     Conjunctiva/sclera: Conjunctivae normal.  Neck:     Musculoskeletal: Neck supple.  Cardiovascular:     Rate and Rhythm: Normal rate and regular rhythm.     Comments: 2+ DP pulses Pulmonary:     Effort: Pulmonary effort is normal. No respiratory distress.     Breath sounds: Normal breath sounds. No wheezing, rhonchi or rales.  Abdominal:     General: There is no distension.     Palpations: Abdomen is soft.     Tenderness: There is no abdominal tenderness.  Musculoskeletal:     Comments: Trace edema to the lower legs without overlying erythema/warmth. No calf tenderness.   Skin:    General: Skin is warm and dry.     Findings: No rash.  Neurological:     Mental Status: She is alert.     Comments: Clear speech.   Psychiatric:        Behavior: Behavior normal.    ED Treatments / Results  Labs (all labs ordered are listed, but only abnormal results are displayed) Labs Reviewed - No data  to display Results for orders placed or performed during the hospital encounter of 03/16/19  COMPLETE METABOLIC PANEL WITH GFR  Result Value Ref Range   Glucose, Bld 117 (H) 65 - 99 mg/dL   BUN 21 7 - 25 mg/dL   Creat 1.611.10 (H) 0.960.60 - 0.88 mg/dL   GFR, Est Non African American 45 (L) > OR = 60 mL/min/1.8373m2   GFR, Est African American 52 (L) > OR = 60 mL/min/1.7673m2   BUN/Creatinine Ratio 19 6 - 22 (calc)   Sodium 138 135 - 146 mmol/L  Potassium 4.0 3.5 - 5.3 mmol/L   Chloride 104 98 - 110 mmol/L   CO2 28 20 - 32 mmol/L   Calcium 9.6 8.6 - 10.4 mg/dL   Total Protein 7.0 6.1 - 8.1 g/dL   Albumin 3.8 3.6 - 5.1 g/dL   Globulin 3.2 1.9 - 3.7 g/dL (calc)   AG Ratio 1.2 1.0 - 2.5 (calc)   Total Bilirubin 0.7 0.2 - 1.2 mg/dL   Alkaline phosphatase (APISO) 65 37 - 153 U/L   AST 15 10 - 35 U/L   ALT 10 6 - 29 U/L  Troponin I - ONCE - STAT  Result Value Ref Range   Troponin I <0.01 < OR = 0.0 ng/mL  D-dimer, quantitative  Result Value Ref Range   D-Dimer, Quant 3.88 (H) <0.50 mcg/mL FEU  TIQ-NTM  Result Value Ref Range   QUESTION/PROBLEM:     SPECIMEN(S) RECEIVED: 25F=NSI   POCT CBC w auto diff  Result Value Ref Range   WBC     Lymphocytes relative %     Monocytes relative %     Neutrophils relative % (GR)     Lymphocytes absolute     Monocyes absolute     Neutrophils absolute (GR#)     RBC     Hemoglobin     Hematocrit     MCV     MCH     MCHC     RDW     Platelet count     MPV      EKG EKG will not cross over through system.    EKG Interpretation  Date/Time: 03/16/2019 @ 21:51   Ventricular Rate: 84   PR Interval: 213   QRS Duration: 93   QT Interval: 377   QTC Calculation: 446     Text Interpretation: No STEMI     Radiology Dg Chest 2 View  Result Date: 03/16/2019 CLINICAL DATA:  Shortness of breath.  Hypertension. EXAM: CHEST - 2 VIEW COMPARISON:  None. FINDINGS: Lungs are clear. Heart is slightly enlarged with pulmonary vascularity normal. No  adenopathy. There is aortic atherosclerosis. There is degenerative change in the thoracic spine. IMPRESSION: Mild cardiac enlargement. No edema or consolidation. Aortic Atherosclerosis (ICD10-I70.0). Electronically Signed   By: Bretta Bang III M.D.   On: 03/16/2019 10:05   Ct Angio Chest Pe W/cm &/or Wo Cm  Result Date: 03/16/2019 CLINICAL DATA:  Shortness of breath for 1 month EXAM: CT ANGIOGRAPHY CHEST WITH CONTRAST TECHNIQUE: Multidetector CT imaging of the chest was performed using the standard protocol during bolus administration of intravenous contrast. Multiplanar CT image reconstructions and MIPs were obtained to evaluate the vascular anatomy. CONTRAST:  OMNIPAQUE IOHEXOL 350 MG/ML SOLN COMPARISON:  03/16/2019 FINDINGS: Cardiovascular: Atherosclerotic calcifications of the aorta are noted. No aneurysmal dilatation or dissection is seen. Mild cardiac enlargement is noted. Diffuse coronary calcifications are seen. Pulmonary artery is well visualized without evidence of filling defect to suggest pulmonary embolism. Mediastinum/Nodes: Thoracic inlet is within normal limits. No hilar or mediastinal adenopathy is noted. The esophagus is within normal limits. Lungs/Pleura: Lungs are well aerated bilaterally with mild dependent atelectatic changes. No focal confluent infiltrate is seen. Upper Abdomen: Visualized upper abdomen is within normal limits. Musculoskeletal: Degenerative changes of the thoracic spine are noted. No acute rib abnormality is noted. Review of the MIP images confirms the above findings. IMPRESSION: No evidence of pulmonary emboli. Electronically Signed   By: Alcide Clever M.D.   On: 03/16/2019 21:57  US Venous Img Lower Unilateral Left  Result Date: 03/16/2019 CLINICAL DATA:  85 year old female with nodule behind the knee EXAM: LEFT LOWER EXTREMITY VENOUS DOPPLER ULTRASOUND TECHNIQUE: Gray-scale sonography with graded compression, as well as color Doppler and duplex  ultrasound were performed to evaluate the lower extremity deep venous systems from the level of the common femoral vein and including the common femoral, femoral, profunda femoral, popliteal and calf veins including the posterior tibial, peroneal and gastrocnemius veins when visible. The superficial great saphenous vein was also interrogated. Spectral Doppler was utilized to evaluate flow at rest and with distal augmentation maneuvers in the common femoral, femoral and popliteal veins. COMPARISON:  None. FINDINGS: Contralateral Common Femoral Vein: Respiratory phasicity is normal and symmetric with the symptomatic side. No evidence of thrombus. Normal compressibility. Common Femoral Vein: No evidence of thrombus. Normal compressibility, respiratory phasicity and response to augmentation. Saphenofemoral Junction: No evidence of thrombus. Normal compressibility and flow on color Doppler imaging. Profunda Femoral Vein: No evidence of thrombus. Normal compressibility and flow on color Doppler imaging. Femoral Vein: No evidence of thrombus. Normal compressibility, respiratory phasicity and response to augmentation. Popliteal Vein: No evidence of thrombus. Normal compressibility, respiratory phasicity and response to augmentation. Calf Veins: No evidence of thrombus. Normal compressibility and flow on color Doppler imaging. Superficial Great Saphenous Vein: No evidence of thrombus. Normal compressibility and flow on color Doppler imaging. Other Findings:  None. IMPRESSION: Sonographic survey of the left lower extremity negative for DVT Electronically Signed   By: Gilmer Mor D.O.   On: 03/16/2019 11:20    Procedures Procedures (including critical care time)  Medications Ordered in ED Medications  iohexol (OMNIPAQUE) 350 MG/ML injection 100 mL (100 mLs Intravenous Contrast Given 03/16/19 2143)     Initial Impression / Assessment and Plan / ED Course  I have reviewed the triage vital signs and the nursing  notes.  Pertinent labs & imaging results that were available during my care of the patient were reviewed by me and considered in my medical decision making (see chart for details).   Patient presents to the ED due to elevated d-dimer at [facility] visit, has had 1 month of worsening dyspnea w/ exertion associated w/ palpitations and at times chest pressure. Nontoxic appearing, no apparent distress, vitals WNL with the exception of elevated BP. Mild/trace edema to lower legs bilaterally, exam otherwise unremarkable.   UC visit reviewed:  CBC: No leukocytosis. Mild anemia w/ hgb .3 consistent with prior ranges.  CMP: Mildly elevated creatinine- similar to prior on record. No significant electrolyte derangement.  Trop: Negative.  EKG: No STEMI.  MVH:QION cardiac enlargement, otherwise unremarkable.  LLE venous duplex: Negative for DVT.   O2 Sat on RA remained 97% or above even while ambulating in UC office, pt is not tachycardic or tachypnic.  Cardiology was consulted- recommended 20 mg of daily Lasix x 3 days, discharged from UC with this medication.   D-dimer 3.88 ---> called and told to come to ER.   Plan for EKG, repeat trop, BNP, and CTA.  EKG: No STEMI Trop: Negative, ACS seems less likely.  BNP: Mild elevation. CTA: Negative for PE.  Cardiac monitor reviewed with some PVCs and rate changes, but no obvious afib or concerning arrhythmias.  Feel patient may benefit from holter monitoring. Discussed findings & plan of care with supervising physician Dr. Rodena Medin- recommends discharge w/ cardiology follow up which I am in agreement with. Take lasix as prescribed by UC. I discussed results, treatment plan, need for  follow-up, and return precautions with the patient & her daughter via telephone. Provided opportunity for questions, patient & her daughter confirmed understanding and are in agreement with plan.    Final Clinical Impressions(s) / ED Diagnoses   Final diagnoses:  Dyspnea,  unspecified type    ED Discharge Orders    None       Desmond Lope 03/16/19 2309    Wynetta Fines, MD 03/16/19 2332

## 2019-03-16 NOTE — ED Notes (Signed)
Walked 1 lap around the UC, P-75-80, O2 sat 98%

## 2019-03-16 NOTE — ED Triage Notes (Signed)
Pt states she has felt SOB for about a month. Denies fever. Off and on sore throat. Denies being around anyone with covid symptoms. Pt states she intermittently  has chest heaviness.

## 2019-03-16 NOTE — ED Triage Notes (Signed)
Pt states that she has had SOB for about 1 month.  She feels like she can not get "enough air."  States she is not having tightness or pain in her chest. Also has a corn on right little toe that has been causing pain, more so at night.

## 2019-03-16 NOTE — Telephone Encounter (Signed)
Still waiting on D-dimer to result. Troponin was WNL.  Consulted with Dr. Cathren Harsh, will add BNP.

## 2019-03-16 NOTE — ED Notes (Signed)
Pulse ox walking from procedure room to door 98%

## 2019-03-16 NOTE — Telephone Encounter (Signed)
Spoke with patient's daughter, Dr Cathren Harsh would like patient to come back in for BNP and EKG tomorrow.  Tina Long stated that she would bring her back in.

## 2019-03-16 NOTE — ED Notes (Signed)
Patient verbalizes understanding of medications and discharge instructions. No further questions at this time. VSS and patient ambulatory at discharge.   

## 2019-03-16 NOTE — ED Notes (Signed)
Pt seen at Urgent care today- positive D-dimer

## 2019-03-16 NOTE — Telephone Encounter (Signed)
Reviewed critical lab- elevated D-dimer, spoke with pt's daughter, advised to go to [facility] immediately for CT scan to r/o PE.

## 2019-03-17 ENCOUNTER — Encounter: Payer: Self-pay | Admitting: Cardiovascular Disease

## 2019-03-17 ENCOUNTER — Telehealth: Payer: Self-pay | Admitting: *Deleted

## 2019-03-17 ENCOUNTER — Ambulatory Visit (INDEPENDENT_AMBULATORY_CARE_PROVIDER_SITE_OTHER): Payer: Medicare Other | Admitting: Cardiovascular Disease

## 2019-03-17 VITALS — BP 124/58 | HR 68 | Ht 65.0 in | Wt 239.0 lb

## 2019-03-17 DIAGNOSIS — R600 Localized edema: Secondary | ICD-10-CM | POA: Diagnosis not present

## 2019-03-17 DIAGNOSIS — I10 Essential (primary) hypertension: Secondary | ICD-10-CM

## 2019-03-17 DIAGNOSIS — R0602 Shortness of breath: Secondary | ICD-10-CM | POA: Diagnosis not present

## 2019-03-17 DIAGNOSIS — Z9989 Dependence on other enabling machines and devices: Secondary | ICD-10-CM

## 2019-03-17 DIAGNOSIS — G4733 Obstructive sleep apnea (adult) (pediatric): Secondary | ICD-10-CM

## 2019-03-17 DIAGNOSIS — I200 Unstable angina: Secondary | ICD-10-CM

## 2019-03-17 HISTORY — DX: Obstructive sleep apnea (adult) (pediatric): G47.33

## 2019-03-17 HISTORY — DX: Unstable angina: I20.0

## 2019-03-17 LAB — COMPLETE METABOLIC PANEL WITH GFR
AG Ratio: 1.2 (calc) (ref 1.0–2.5)
ALT: 10 U/L (ref 6–29)
AST: 15 U/L (ref 10–35)
Albumin: 3.8 g/dL (ref 3.6–5.1)
Alkaline phosphatase (APISO): 65 U/L (ref 37–153)
BUN/Creatinine Ratio: 19 (calc) (ref 6–22)
BUN: 21 mg/dL (ref 7–25)
CO2: 28 mmol/L (ref 20–32)
Calcium: 9.6 mg/dL (ref 8.6–10.4)
Chloride: 104 mmol/L (ref 98–110)
Creat: 1.1 mg/dL — ABNORMAL HIGH (ref 0.60–0.88)
GFR, Est African American: 52 mL/min/{1.73_m2} — ABNORMAL LOW (ref >=60)
GFR, Est Non African American: 45 mL/min/{1.73_m2} — ABNORMAL LOW (ref >=60)
Globulin: 3.2 g/dL (calc) (ref 1.9–3.7)
Glucose, Bld: 117 mg/dL — ABNORMAL HIGH (ref 65–99)
Potassium: 4 mmol/L (ref 3.5–5.3)
Sodium: 138 mmol/L (ref 135–146)
Total Bilirubin: 0.7 mg/dL (ref 0.2–1.2)
Total Protein: 7 g/dL (ref 6.1–8.1)

## 2019-03-17 LAB — TROPONIN I: Troponin I: <0.01 ng/mL (ref <=0.0)

## 2019-03-17 LAB — D-DIMER, QUANTITATIVE: D-Dimer, Quant: 3.88 mcg/mL FEU — ABNORMAL HIGH (ref <0.50)

## 2019-03-17 NOTE — H&P (View-Only) (Signed)
Cardiology Office Note  Date:  03/17/2019   ID:  [name], DOB 04/20/1928, MRN [id number]  PCP:  Heloise Ochoa, PA-C  Cardiologist:   Chilton Si, MD   No chief complaint on file.   History of Present Illness: Tina Long is a 85 y.o. female with hypertension, OSA and GERD who is being seen today for the evaluation of shortness of breath at the request of Harvie Heck, PA.  Tina Long reports increasing exertional dyspnea since January.  It seems to be getting worse over the last month.  For the last three months it has also been associated with nausea and chest pressure.  She denies diaphoresis. She has noticed L>R LE edema.  Her legs are tender to palpation.  She is currently staying with her daughter and son-in-law due to COVID-19.  They have noticed that she is slowing down and is very short of breath after walking to the mailbox and back.  At baseline she is very independent, active and able to drive.   Tina Long was first seen in urgent care on 4/14 due to shortness of breath x1 month.   D-dimer was elevated to 3.88 so she was referred to the ED.  In the ED she had negative L LE Dopplers.  She also had a chest CT that showed diffuse calcification of the coronary arteries but no PE. CXR was suggestive of mild cardiomegaly.  There was no pulmonary edema or infiltrate.  She was noted to have atherosclerosis of the aorta.  BNP was mildly elevated at 204.  She denies orthopnea or PND.  She was started on lasix 20mg  daily x3 days.  She has taken one dose without any changes.  It did not increase her UOP.  Past Medical History:  Diagnosis Date  . Arthritis   . Diabetes mellitus without complication (HCC)    borderline   . GERD (gastroesophageal reflux disease)    hx of   . Hypertension   . OSA on CPAP 03/17/2019  . Sleep apnea    cpap   . Unstable angina (HCC) 03/17/2019  . Urinary urgency     Past Surgical History:  Procedure Laterality Date  . ABDOMINAL  HYSTERECTOMY    . APPENDECTOMY    . EYE SURGERY     bilateral cataracts with lens implants  . TOTAL KNEE ARTHROPLASTY Right 07/19/2015   Procedure: RIGHT TOTAL KNEE ARTHROPLASTY;  Surgeon: Ollen Gross, MD;  Location: [facility];  Service: Orthopedics;  Laterality: Right;     Current Outpatient Medications  Medication Sig Dispense Refill  . aspirin EC 81 MG tablet Take 81 mg by mouth daily.    . furosemide (LASIX) 20 MG tablet Take 1 tablet (20 mg total) by mouth daily for 3 days. 3 tablet 0  . losartan (COZAAR) 100 MG tablet Take 100 mg by mouth daily.     . metoprolol tartrate (LOPRESSOR) 25 MG tablet Take 12.5 mg by mouth 2 (two) times daily.     Marland Kitchen UNABLE TO FIND C-Pap     No current facility-administered medications for this visit.     Allergies:   Morphine and related and Tramadol    Social History:  The patient  reports that she has never smoked. She has never used smokeless tobacco. She reports that she does not drink alcohol or use drugs.   Family History:  The patient's family history is not on file.    ROS:  Please see the history  of present illness.   Otherwise, review of systems are positive for none.   All other systems are reviewed and negative.    PHYSICAL EXAM: VS:  BP (!) 124/58   Pulse 68   Ht 5' 5" (1.651 m)   Wt 239 lb (108.4 kg)   BMI 39.77 kg/m  , BMI Body mass index is 39.77 kg/m. GENERAL:  Well appearing.   No acute distress. HEENT:  Pupils equal round and reactive, fundi not visualized, oral mucosa unremarkable NECK:  No jugular venous distention, waveform within normal limits, carotid upstroke brisk and symmetric, no bruits, no thyromegaly LYMPHATICS:  No cervical adenopathy LUNGS:  Clear to auscultation bilaterally HEART:  RRR.  PMI not displaced or sustained,S1 and S2 within normal limits, no S3, no S4, no clicks, no rubs, no murmurs ABD:  Flat, positive bowel sounds normal in frequency in pitch, no bruits, no rebound, no guarding, no midline  pulsatile mass, no hepatomegaly, no splenomegaly EXT:  2 plus pulses throughout, no edema, no cyanosis no clubbing SKIN:  No rashes no nodules NEURO:  Cranial nerves II through XII grossly intact, motor grossly intact throughout PSYCH:  Cognitively intact, oriented to person place and time    EKG:  EKG is ordered today. The ekg ordered today demonstrates sinus rhythm.  Rate 80 bpm.  Atrial bigeminy.   Recent Labs: 03/16/2019: ALT 10; B Natriuretic Peptide 204.6; BUN 21; Creat 1.10; Potassium 4.0; Sodium 138    Lipid Panel No results found for: CHOL, TRIG, HDL, CHOLHDL, VLDL, LDLCALC, LDLDIRECT    Wt Readings from Last 3 Encounters:  03/17/19 239 lb (108.4 kg)  03/16/19 236 lb (107 kg)  03/16/19 228 lb (103.4 kg)      ASSESSMENT AND PLAN:  # Unstable angina: Symptoms are concerning for angina.  Chest CT showed coronary calcium in the LAD and RCA distributions.  She has progressive exertional dyspnea and now chest pressure with exertion.  We will plan to get a left and right cardiac catheterization.  Start aspirin 81mg daily.  She will need lipids/CMP checked when able.  # LE Edema: # Shortness of breath: BNP mildly elevated.  No pulmonary edema but she does have LE edema on exam.  LE Dopplers were negative.  We will plan to get LHC tomorrow.  She will need left ventriculogram and LV pressure.  Once COVID-19 restrictions are lifted will plan to get an echo.  # Hypertension: BP well-controlled on losartan and metoprolol.   Current medicines are reviewed at length with the patient today.  The patient does not have concerns regarding medicines.  The following changes have been made:  Start aspirin 81mg daily.  Labs/ tests ordered today include:  No orders of the defined types were placed in this encounter.    Disposition:   FU with Valdez Brannan C. [place], MD, FACC in 2 weeks.     Signed, Zamiah Tollett C. [place], MD, FACC  03/17/2019 8:36 PM    [facility] 

## 2019-03-17 NOTE — Patient Instructions (Addendum)
Medication Instructions:  START ASPIRIN 81 MG DAILY   If you need a refill on your cardiac medications before your next appointment, please call your pharmacy.   Lab work: NONE  Testing/Procedures: Your physician has requested that you have a cardiac catheterization. Cardiac catheterization is used to diagnose and/or treat various heart conditions. Doctors may recommend this procedure for a number of different reasons. The most common reason is to evaluate chest pain. Chest pain can be a symptom of coronary artery disease (CAD), and cardiac catheterization can show whether plaque is narrowing or blocking your heart's arteries. This procedure is also used to evaluate the valves, as well as measure the blood flow and oxygen levels in different parts of your heart. For further information please visit [email or web address] Please follow instruction sheet, as given.   Follow-Up: VIDEO VISIT 2 WEEKS 03/31/19  Any Other Special Instructions Will Be Listed Below (If Applicable).               [facility] CARDIOVASCULAR DIVISION [facility] [address] [place] [address] [place] [address] Dept: [phone] Loc: [phone]  CYLAH FRIEDT  03/17/2019  You are scheduled for a Cardiac Catheterization on Thursday, April 16 with Dr. Nicki Guadalajara.  1. Please arrive at the [facility] (Main Entrance A) at [facility]: [address] [place], [place] [address] at 5:30 AM (This time is two hours before your procedure to ensure your preparation). Free valet parking service is available.   Special note: Every effort is made to have your procedure done on time. Please understand that emergencies sometimes delay scheduled procedures.  2. Diet: Do not eat solid foods after midnight.  The patient may have clear liquids until 5am upon the day of the procedure.  3. Labs: DONE   4. Medication instructions in preparation for your procedure:   Contrast Allergy: No  NO LOSARTAN OR FUROSAMIDE TOMORROW MORNING   On the morning of your procedure, take your Aspirin and any morning medicines NOT listed above.  You may use sips of water.  5. Plan for one night stay--bring personal belongings. 6. Bring a current list of your medications and current insurance cards. 7. You MUST have a responsible person to drive you home. 8. Someone MUST be with you the first 24 hours after you arrive home or your discharge will be delayed. 9. Please wear clothes that are easy to get on and off and wear slip-on shoes.  Thank you for allowing [place] to care for you!   -- [facility]

## 2019-03-17 NOTE — Telephone Encounter (Signed)
error 

## 2019-03-17 NOTE — Addendum Note (Signed)
Addended by: Chilton Si C on: 03/17/2019 08:44 PM   Modules accepted: Orders, SmartSet

## 2019-03-17 NOTE — Progress Notes (Addendum)
Cardiology Office Note  Date:  03/17/2019   ID:  Tina Long, DOB 04/20/1928, MRN [id number]  PCP:  Heloise Ochoa, PA-C  Cardiologist:   Chilton Si, MD   No chief complaint on file.   History of Present Illness: Tina Long is a 85 y.o. female with hypertension, OSA and GERD who is being seen today for Tina evaluation of shortness of breath at Tina request of Harvie Heck, PA.  Tina Long reports increasing exertional dyspnea since January.  It seems to be getting worse over Tina last month.  For Tina last three months it has also been associated with nausea and chest pressure.  Tina Long denies diaphoresis. Tina Long has noticed L>R LE edema.  Tina Long legs are tender to palpation.  Tina Long is currently staying with Tina Long daughter and son-in-law due to COVID-19.  They have noticed that Tina Long is slowing down and is very short of breath after walking to Tina mailbox and back.  At baseline Tina Long is very independent, active and able to drive.   Tina Long was first seen in urgent care on 4/14 due to shortness of breath x1 month.   D-dimer was elevated to 3.88 so Tina Long was referred to Tina ED.  In Tina ED Tina Long had negative L LE Dopplers.  Tina Long also had a chest CT that showed diffuse calcification of Tina coronary arteries but no PE. CXR was suggestive of mild cardiomegaly.  There was no pulmonary edema or infiltrate.  Tina Long was noted to have atherosclerosis of Tina aorta.  BNP was mildly elevated at 204.  Tina Long denies orthopnea or PND.  Tina Long was started on lasix 20mg  daily x3 days.  Tina Long has taken one dose without any changes.  It did not increase Tina Long UOP.  Past Medical History:  Diagnosis Date  . Arthritis   . Diabetes mellitus without complication (HCC)    borderline   . GERD (gastroesophageal reflux disease)    hx of   . Hypertension   . OSA on CPAP 03/17/2019  . Sleep apnea    cpap   . Unstable angina (HCC) 03/17/2019  . Urinary urgency     Past Surgical History:  Procedure Laterality Date  . ABDOMINAL  HYSTERECTOMY    . APPENDECTOMY    . EYE SURGERY     bilateral cataracts with lens implants  . TOTAL KNEE ARTHROPLASTY Right 07/19/2015   Procedure: RIGHT TOTAL KNEE ARTHROPLASTY;  Surgeon: Ollen Gross, MD;  Location: [facility];  Service: Orthopedics;  Laterality: Right;     Current Outpatient Medications  Medication Sig Dispense Refill  . aspirin EC 81 MG tablet Take 81 mg by mouth daily.    . furosemide (LASIX) 20 MG tablet Take 1 tablet (20 mg total) by mouth daily for 3 days. 3 tablet 0  . losartan (COZAAR) 100 MG tablet Take 100 mg by mouth daily.     . metoprolol tartrate (LOPRESSOR) 25 MG tablet Take 12.5 mg by mouth 2 (two) times daily.     Marland Kitchen UNABLE TO FIND C-Pap     No current facility-administered medications for this visit.     Allergies:   Morphine and related and Tramadol    Social History:  Tina Long  reports that Tina Long has never smoked. Tina Long has never used smokeless tobacco. Tina Long reports that Tina Long does not drink alcohol or use drugs.   Family History:  Tina Long's family history is not on file.    ROS:  Please see Tina history  of present illness.   Otherwise, review of systems are positive for none.   All other systems are reviewed and negative.    PHYSICAL EXAM: VS:  BP (!) 124/58   Pulse 68   Ht 5\' 5"  (1.651 m)   Wt 239 lb (108.4 kg)   BMI 39.77 kg/m  , BMI Body mass index is 39.77 kg/m. GENERAL:  Well appearing.   No acute distress. HEENT:  Pupils equal round and reactive, fundi not visualized, oral mucosa unremarkable NECK:  No jugular venous distention, waveform within normal limits, carotid upstroke brisk and symmetric, no bruits, no thyromegaly LYMPHATICS:  No cervical adenopathy LUNGS:  Clear to auscultation bilaterally HEART:  RRR.  PMI not displaced or sustained,S1 and S2 within normal limits, no S3, no S4, no clicks, no rubs, no murmurs ABD:  Flat, positive bowel sounds normal in frequency in pitch, no bruits, no rebound, no guarding, no midline  pulsatile mass, no hepatomegaly, no splenomegaly EXT:  2 plus pulses throughout, no edema, no cyanosis no clubbing SKIN:  No rashes no nodules NEURO:  Cranial nerves II through XII grossly intact, motor grossly intact throughout PSYCH:  Cognitively intact, oriented to person place and time    EKG:  EKG is ordered today. Tina ekg ordered today demonstrates sinus rhythm.  Rate 80 bpm.  Atrial bigeminy.   Recent Labs: 03/16/2019: ALT 10; B Natriuretic Peptide 204.6; BUN 21; Creat 1.10; Potassium 4.0; Sodium 138    Lipid Panel No results found for: CHOL, TRIG, HDL, CHOLHDL, VLDL, LDLCALC, LDLDIRECT    Wt Readings from Last 3 Encounters:  03/17/19 239 lb (108.4 kg)  03/16/19 236 lb (107 kg)  03/16/19 228 lb (103.4 kg)      ASSESSMENT AND PLAN:  # Unstable angina: Symptoms are concerning for angina.  Chest CT showed coronary calcium in Tina LAD and RCA distributions.  Tina Long has progressive exertional dyspnea and now chest pressure with exertion.  We Long plan to get a left and right cardiac catheterization.  Start aspirin 81mg  daily.  Tina Long need lipids/CMP checked when able.  # LE Edema: # Shortness of breath: BNP mildly elevated.  No pulmonary edema but Tina Long does have LE edema on exam.  LE Dopplers were negative.  [facility]  Tina Long need left ventriculogram and LV pressure.  Once COVID-19 restrictions are lifted Long plan to get an echo.  # Hypertension: BP well-controlled on losartan and metoprolol.   Current medicines are reviewed at length with Tina Long today.  Tina Long does not have concerns regarding medicines.  Tina following changes have been made:  Start aspirin 81mg  daily.  Labs/ tests ordered today include:  No orders of Tina defined types were placed in this encounter.    Disposition:   FU with Luan Maberry C. Duke Salviaandolph, MD, [facility] in 2 weeks.     Signed, Ariadna Setter C. Duke Salviaandolph, MD, [facility]  03/17/2019 8:36 PM    [facility]

## 2019-03-18 ENCOUNTER — Other Ambulatory Visit: Payer: Self-pay

## 2019-03-18 ENCOUNTER — Ambulatory Visit (HOSPITAL_COMMUNITY)
Admission: RE | Admit: 2019-03-18 | Discharge: 2019-03-18 | Disposition: A | Discharge status: Home / Self Care | Payer: Medicare Other | Attending: Cardiovascular Disease | Admitting: Cardiovascular Disease

## 2019-03-18 ENCOUNTER — Encounter (HOSPITAL_COMMUNITY)
Admission: RE | Disposition: A | Discharge status: Home / Self Care | Payer: Self-pay | Source: Home / Self Care | Attending: Cardiovascular Disease

## 2019-03-18 DIAGNOSIS — I200 Unstable angina: Secondary | ICD-10-CM | POA: Diagnosis not present

## 2019-03-18 DIAGNOSIS — Z888 Allergy status to other drugs, medicaments and biological substances status: Secondary | ICD-10-CM | POA: Insufficient documentation

## 2019-03-18 DIAGNOSIS — Z79899 Other long term (current) drug therapy: Secondary | ICD-10-CM | POA: Insufficient documentation

## 2019-03-18 DIAGNOSIS — I10 Essential (primary) hypertension: Secondary | ICD-10-CM | POA: Diagnosis not present

## 2019-03-18 DIAGNOSIS — E119 Type 2 diabetes mellitus without complications: Secondary | ICD-10-CM | POA: Diagnosis not present

## 2019-03-18 DIAGNOSIS — K219 Gastro-esophageal reflux disease without esophagitis: Secondary | ICD-10-CM | POA: Diagnosis not present

## 2019-03-18 DIAGNOSIS — R600 Localized edema: Secondary | ICD-10-CM | POA: Insufficient documentation

## 2019-03-18 DIAGNOSIS — R0602 Shortness of breath: Secondary | ICD-10-CM | POA: Diagnosis present

## 2019-03-18 DIAGNOSIS — G4733 Obstructive sleep apnea (adult) (pediatric): Secondary | ICD-10-CM | POA: Diagnosis not present

## 2019-03-18 DIAGNOSIS — Z885 Allergy status to narcotic agent status: Secondary | ICD-10-CM | POA: Insufficient documentation

## 2019-03-18 HISTORY — PX: RIGHT/LEFT HEART CATH AND CORONARY ANGIOGRAPHY: CATH118266

## 2019-03-18 LAB — GLUCOSE, CAPILLARY: Glucose-Capillary: 107 mg/dL — ABNORMAL HIGH (ref 70–99)

## 2019-03-18 SURGERY — RIGHT/LEFT HEART CATH AND CORONARY ANGIOGRAPHY
Anesthesia: LOCAL

## 2019-03-18 MED ORDER — MIDAZOLAM HCL 2 MG/2ML IJ SOLN
INTRAMUSCULAR | Status: AC
  Filled 2019-03-18: qty 2

## 2019-03-18 MED ORDER — HYDRALAZINE HCL 20 MG/ML IJ SOLN
INTRAMUSCULAR | Status: AC
  Filled 2019-03-18: qty 1

## 2019-03-18 MED ORDER — LABETALOL HCL 5 MG/ML IV SOLN: 10.0000 mg | INTRAVENOUS | Status: DC | PRN

## 2019-03-18 MED ORDER — SODIUM CHLORIDE 0.9% FLUSH: 3.0000 mL | Freq: Two times a day (BID) | INTRAVENOUS | Status: DC

## 2019-03-18 MED ORDER — LIDOCAINE HCL (PF) 1 % IJ SOLN
INTRAMUSCULAR | Status: AC
  Filled 2019-03-18: qty 30

## 2019-03-18 MED ORDER — HEPARIN (PORCINE) IN NACL 1000-0.9 UT/500ML-% IV SOLN
INTRAVENOUS | Status: DC | PRN
  Administered 2019-03-18 (×2): 500 mL

## 2019-03-18 MED ORDER — ASPIRIN 81 MG PO CHEW: 81.0000 mg | CHEWABLE_TABLET | ORAL | Status: DC

## 2019-03-18 MED ORDER — HYDRALAZINE HCL 20 MG/ML IJ SOLN: 10.0000 mg | INTRAMUSCULAR | Status: DC | PRN

## 2019-03-18 MED ORDER — MIDAZOLAM HCL 2 MG/2ML IJ SOLN
INTRAMUSCULAR | Status: DC | PRN
  Administered 2019-03-18: 1 mg via INTRAVENOUS

## 2019-03-18 MED ORDER — ACETAMINOPHEN 325 MG PO TABS
650.0000 mg | ORAL_TABLET | ORAL | Status: DC | PRN
  Administered 2019-03-18: 650 mg via ORAL
  Filled 2019-03-18: qty 2

## 2019-03-18 MED ORDER — SODIUM CHLORIDE 0.9 % IV SOLN: INTRAVENOUS | Status: AC

## 2019-03-18 MED ORDER — ONDANSETRON HCL 4 MG/2ML IJ SOLN: 4.0000 mg | Freq: Four times a day (QID) | INTRAMUSCULAR | Status: DC | PRN

## 2019-03-18 MED ORDER — HEPARIN (PORCINE) IN NACL 1000-0.9 UT/500ML-% IV SOLN
INTRAVENOUS | Status: AC
  Filled 2019-03-18: qty 1000

## 2019-03-18 MED ORDER — LIDOCAINE HCL (PF) 1 % IJ SOLN
INTRAMUSCULAR | Status: DC | PRN
  Administered 2019-03-18 (×2): 2 mL

## 2019-03-18 MED ORDER — HEPARIN SODIUM (PORCINE) 1000 UNIT/ML IJ SOLN
INTRAMUSCULAR | Status: DC | PRN
  Administered 2019-03-18: 5000 [IU] via INTRAVENOUS

## 2019-03-18 MED ORDER — HEPARIN SODIUM (PORCINE) 1000 UNIT/ML IJ SOLN
INTRAMUSCULAR | Status: AC
  Filled 2019-03-18: qty 1

## 2019-03-18 MED ORDER — SODIUM CHLORIDE 0.9 % IV SOLN: 250.0000 mL | INTRAVENOUS | Status: DC | PRN

## 2019-03-18 MED ORDER — HYDRALAZINE HCL 20 MG/ML IJ SOLN
INTRAMUSCULAR | Status: DC | PRN
  Administered 2019-03-18: 10 mg via INTRAVENOUS

## 2019-03-18 MED ORDER — SODIUM CHLORIDE 0.9% FLUSH: 3.0000 mL | INTRAVENOUS | Status: DC | PRN

## 2019-03-18 MED ORDER — VERAPAMIL HCL 2.5 MG/ML IV SOLN
INTRAVENOUS | Status: AC
  Filled 2019-03-18: qty 2

## 2019-03-18 MED ORDER — IOHEXOL 350 MG/ML SOLN
INTRAVENOUS | Status: DC | PRN
  Administered 2019-03-18: 90 mL via INTRA_ARTERIAL

## 2019-03-18 MED ORDER — SODIUM CHLORIDE 0.9 % IV SOLN: INTRAVENOUS | Status: DC

## 2019-03-18 MED ORDER — VERAPAMIL HCL 2.5 MG/ML IV SOLN
INTRAVENOUS | Status: DC | PRN
  Administered 2019-03-18: 08:00:00 10 mL via INTRA_ARTERIAL

## 2019-03-18 MED ORDER — ASPIRIN 81 MG PO CHEW: 81.0000 mg | CHEWABLE_TABLET | Freq: Every day | ORAL | Status: DC

## 2019-03-18 SURGICAL SUPPLY — 16 items
CATH BALLN WEDGE 5F 110CM (CATHETERS) ×1 IMPLANT
CATH INFINITI 5FR ANG PIGTAIL (CATHETERS) ×1 IMPLANT
CATH INFINITI JR4 5F (CATHETERS) ×1 IMPLANT
CATH OPTITORQUE TIG 4.0 5F (CATHETERS) ×1 IMPLANT
DEVICE RAD COMP TR BAND LRG (VASCULAR PRODUCTS) ×1 IMPLANT
GLIDESHEATH SLEND SS 6F .021 (SHEATH) ×1 IMPLANT
GUIDEWIRE .025 260CM (WIRE) ×1 IMPLANT
GUIDEWIRE INQWIRE 1.5J.035X260 (WIRE) IMPLANT
INQWIRE 1.5J .035X260CM (WIRE) ×2
KIT HEART LEFT (KITS) ×2 IMPLANT
PACK CARDIAC CATHETERIZATION (CUSTOM PROCEDURE TRAY) ×2 IMPLANT
SHEATH GLIDE SLENDER 4/5FR (SHEATH) ×1 IMPLANT
SYR MEDRAD MARK 7 150ML (SYRINGE) ×2 IMPLANT
TRANSDUCER W/STOPCOCK (MISCELLANEOUS) ×2 IMPLANT
TUBING CIL FLEX 10 FLL-RA (TUBING) ×2 IMPLANT
WIRE HI TORQ VERSACORE-J 145CM (WIRE) ×1 IMPLANT

## 2019-03-18 NOTE — Discharge Instructions (Signed)
Radial Site Care ° °This sheet gives you information about how to care for yourself after your procedure. Your health care provider may also give you more specific instructions. If you have problems or questions, contact your health care provider. °What can I expect after the procedure? °After the procedure, it is common to have: °· Bruising and tenderness at the catheter insertion area. °Follow these instructions at home: °Medicines °· Take over-the-counter and prescription medicines only as told by your health care provider. °Insertion site care °· Follow instructions from your health care provider about how to take care of your insertion site. Make sure you: °? Wash your hands with soap and water before you change your bandage (dressing). If soap and water are not available, use hand sanitizer. °? Change your dressing as told by your health care provider. °? Leave stitches (sutures), skin glue, or adhesive strips in place. These skin closures may need to stay in place for 2 weeks or longer. If adhesive strip edges start to loosen and curl up, you may trim the loose edges. Do not remove adhesive strips completely unless your health care provider tells you to do that. °· Check your insertion site every day for signs of infection. Check for: °? Redness, swelling, or pain. °? Fluid or blood. °? Pus or a bad smell. °? Warmth. °· Do not take baths, swim, or use a hot tub until your health care provider approves. °· You may shower 24-48 hours after the procedure, or as directed by your health care provider. °? Remove the dressing and gently wash the site with plain soap and water. °? Pat the area dry with a clean towel. °? Do not rub the site. That could cause bleeding. °· Do not apply powder or lotion to the site. °Activity ° °· For 24 hours after the procedure, or as directed by your health care provider: °? Do not flex or bend the affected arm. °? Do not push or pull heavy objects with the affected arm. °? Do not  drive yourself home from the hospital or clinic. You may drive 24 hours after the procedure unless your health care provider tells you not to. °? Do not operate machinery or power tools. °· Do not lift anything that is heavier than 10 lb (4.5 kg), or the limit that you are told, until your health care provider says that it is safe. °· Ask your health care provider when it is okay to: °? Return to work or school. °? Resume usual physical activities or sports. °? Resume sexual activity. °General instructions °· If the catheter site starts to bleed, raise your arm and put firm pressure on the site. If the bleeding does not stop, get help right away. This is a medical emergency. °· If you went home on the same day as your procedure, a responsible adult should be with you for the first 24 hours after you arrive home. °· Keep all follow-up visits as told by your health care provider. This is important. °Contact a health care provider if: °· You have a fever. °· You have redness, swelling, or yellow drainage around your insertion site. °Get help right away if: °· You have unusual pain at the radial site. °· The catheter insertion area swells very fast. °· The insertion area is bleeding, and the bleeding does not stop when you hold steady pressure on the area. °· Your arm or hand becomes pale, cool, tingly, or numb. °These symptoms may represent a serious problem   that is an emergency. Do not wait to see if the symptoms will go away. Get medical help right away. Call your local emergency services (911 in the [place] Do not drive yourself to the hospital. °Summary °· After the procedure, it is common to have bruising and tenderness at the site. °· Follow instructions from your health care provider about how to take care of your radial site wound. Check the wound every day for signs of infection. °· Do not lift anything that is heavier than 10 lb (4.5 kg), or the limit that you are told, until your health care provider says  that it is safe. °This information is not intended to replace advice given to you by your health care provider. Make sure you discuss any questions you have with your health care provider. °Document Released: 12/21/2010 Document Revised: 12/24/2017 Document Reviewed: 12/24/2017 °Elsevier Interactive Patient Education © 2019 [employer] ° °

## 2019-03-18 NOTE — Progress Notes (Signed)
RN informed Dr. Tresa Endo that patient stated feeling very dizzy after walking to bathroom x2.  RN checked patient orthostatics and are as follows: Seated: 127/64 Standing: 120/60 Patient also states short of breath with o2 sat at 100% on RA.   Dr. Tresa Endo okay to keep patient here until feeling better

## 2019-03-18 NOTE — Interval H&P Note (Signed)
Cath Lab Visit (complete for each Cath Lab visit)  Clinical Evaluation Leading to the Procedure:   ACS: No.  Non-ACS:    Anginal Classification: CCS III  Anti-ischemic medical therapy: Minimal Therapy (1 class of medications)  Non-Invasive Test Results: No non-invasive testing performed  Prior CABG: No previous CABG      History and Physical Interval Note:  03/18/2019 7:38 AM  Tina Long  has presented today for surgery, with the diagnosis of Shortness of breath.  The various methods of treatment have been discussed with the patient and family. After consideration of risks, benefits and other options for treatment, the patient has consented to  Procedure(s): RIGHT/LEFT HEART CATH AND CORONARY ANGIOGRAPHY (N/A) as a surgical intervention.  The patient's history has been reviewed, patient examined, no change in status, stable for surgery.  I have reviewed the patient's chart and labs.  Questions were answered to the patient's satisfaction.     Nicki Guadalajara

## 2019-03-18 NOTE — Interval H&P Note (Signed)
History and Physical Interval Note:  03/18/2019 7:38 AM  Tina Long  has presented today for surgery, with the diagnosis of Shortness of breath.  The various methods of treatment have been discussed with the patient and family. After consideration of risks, benefits and other options for treatment, the patient has consented to  Procedure(s): RIGHT/LEFT HEART CATH AND CORONARY ANGIOGRAPHY (N/A) as a surgical intervention.  The patient's history has been reviewed, patient examined, no change in status, stable for surgery.  I have reviewed the patient's chart and labs.  Questions were answered to the patient's satisfaction.     Nicki Guadalajara

## 2019-03-18 NOTE — Progress Notes (Signed)
Spoke with daughter Eber Jones over the phone and gave discharge instructions.  All questions answered and voiced understanding of instructions

## 2019-03-19 ENCOUNTER — Encounter (HOSPITAL_COMMUNITY): Payer: Self-pay | Admitting: Cardiovascular Disease

## 2019-03-19 LAB — POCT I-STAT 7, (LYTES, BLD GAS, ICA,H+H)
Acid-base deficit: 1 mmol/L (ref 0.0–2.0)
Bicarbonate: 24.8 mmol/L (ref 20.0–28.0)
Calcium, Ion: 1.2 mmol/L (ref 1.15–1.40)
HCT: 31 % — ABNORMAL LOW (ref 36.0–46.0)
Hemoglobin: 10.5 g/dL — ABNORMAL LOW (ref 12.0–15.0)
O2 Saturation: 100 %
Potassium: 3.3 mmol/L — ABNORMAL LOW (ref 3.5–5.1)
Sodium: 131 mmol/L — ABNORMAL LOW (ref 135–145)
TCO2: 26 mmol/L (ref 22–32)
pCO2 arterial: 47.2 mmHg (ref 32.0–48.0)
pH, Arterial: 7.329 — ABNORMAL LOW (ref 7.350–7.450)
pO2, Arterial: 323 mmHg — ABNORMAL HIGH (ref 83.0–108.0)

## 2019-03-19 LAB — POCT I-STAT EG7
Acid-Base Excess: 1 mmol/L (ref 0.0–2.0)
Acid-Base Excess: 1 mmol/L (ref 0.0–2.0)
Acid-Base Excess: 1 mmol/L (ref 0.0–2.0)
Acid-base deficit: 1 mmol/L (ref 0.0–2.0)
Bicarbonate: 25.1 mmol/L (ref 20.0–28.0)
Bicarbonate: 25.7 mmol/L (ref 20.0–28.0)
Bicarbonate: 26.7 mmol/L (ref 20.0–28.0)
Bicarbonate: 27.1 mmol/L (ref 20.0–28.0)
Bicarbonate: 24.9 mmol/L (ref 20.0–28.0)
Bicarbonate: 24.9 mmol/L (ref 20.0–28.0)
Bicarbonate: 25 mmol/L (ref 20.0–28.0)
Bicarbonate: 25.1 mmol/L (ref 20.0–28.0)
Bicarbonate: 25.7 mmol/L (ref 20.0–28.0)
Calcium, Ion: 1.22 mmol/L (ref 1.15–1.40)
Calcium, Ion: 1.24 mmol/L (ref 1.15–1.40)
Calcium, Ion: 1.26 mmol/L (ref 1.15–1.40)
Calcium, Ion: 1.27 mmol/L (ref 1.15–1.40)
Calcium, Ion: 1.2 mmol/L (ref 1.15–1.40)
Calcium, Ion: 1.2 mmol/L (ref 1.15–1.40)
Calcium, Ion: 1.21 mmol/L (ref 1.15–1.40)
Calcium, Ion: 1.21 mmol/L (ref 1.15–1.40)
Calcium, Ion: 1.23 mmol/L (ref 1.15–1.40)
HCT: 31 % — ABNORMAL LOW (ref 36.0–46.0)
HCT: 31 % — ABNORMAL LOW (ref 36.0–46.0)
HCT: 32 % — ABNORMAL LOW (ref 36.0–46.0)
HCT: 32 % — ABNORMAL LOW (ref 36.0–46.0)
HCT: 29 % — ABNORMAL LOW (ref 36.0–46.0)
HCT: 30 % — ABNORMAL LOW (ref 36.0–46.0)
HCT: 31 % — ABNORMAL LOW (ref 36.0–46.0)
HCT: 31 % — ABNORMAL LOW (ref 36.0–46.0)
HCT: 31 % — ABNORMAL LOW (ref 36.0–46.0)
Hemoglobin: 10.5 g/dL — ABNORMAL LOW (ref 12.0–15.0)
Hemoglobin: 10.5 g/dL — ABNORMAL LOW (ref 12.0–15.0)
Hemoglobin: 10.9 g/dL — ABNORMAL LOW (ref 12.0–15.0)
Hemoglobin: 10.9 g/dL — ABNORMAL LOW (ref 12.0–15.0)
Hemoglobin: 10.2 g/dL — ABNORMAL LOW (ref 12.0–15.0)
Hemoglobin: 10.5 g/dL — ABNORMAL LOW (ref 12.0–15.0)
Hemoglobin: 10.5 g/dL — ABNORMAL LOW (ref 12.0–15.0)
Hemoglobin: 10.5 g/dL — ABNORMAL LOW (ref 12.0–15.0)
Hemoglobin: 9.9 g/dL — ABNORMAL LOW (ref 12.0–15.0)
O2 Saturation: 83 %
O2 Saturation: 84 %
O2 Saturation: 84 %
O2 Saturation: 85 %
O2 Saturation: 84 %
O2 Saturation: 85 %
O2 Saturation: 88 %
O2 Saturation: 88 %
O2 Saturation: 88 %
Potassium: 3.5 mmol/L (ref 3.5–5.1)
Potassium: 3.5 mmol/L (ref 3.5–5.1)
Potassium: 3.5 mmol/L (ref 3.5–5.1)
Potassium: 3.6 mmol/L (ref 3.5–5.1)
Potassium: 3.3 mmol/L — ABNORMAL LOW (ref 3.5–5.1)
Potassium: 3.4 mmol/L — ABNORMAL LOW (ref 3.5–5.1)
Potassium: 3.4 mmol/L — ABNORMAL LOW (ref 3.5–5.1)
Potassium: 3.4 mmol/L — ABNORMAL LOW (ref 3.5–5.1)
Potassium: 3.4 mmol/L — ABNORMAL LOW (ref 3.5–5.1)
Sodium: 138 mmol/L (ref 135–145)
Sodium: 138 mmol/L (ref 135–145)
Sodium: 139 mmol/L (ref 135–145)
Sodium: 139 mmol/L (ref 135–145)
Sodium: 138 mmol/L (ref 135–145)
Sodium: 139 mmol/L (ref 135–145)
Sodium: 139 mmol/L (ref 135–145)
Sodium: 140 mmol/L (ref 135–145)
Sodium: 140 mmol/L (ref 135–145)
TCO2: 26 mmol/L (ref 22–32)
TCO2: 27 mmol/L (ref 22–32)
TCO2: 28 mmol/L (ref 22–32)
TCO2: 29 mmol/L (ref 22–32)
TCO2: 26 mmol/L (ref 22–32)
TCO2: 26 mmol/L (ref 22–32)
TCO2: 26 mmol/L (ref 22–32)
TCO2: 26 mmol/L (ref 22–32)
TCO2: 27 mmol/L (ref 22–32)
pCO2, Ven: 45.1 mmHg (ref 44.0–60.0)
pCO2, Ven: 46.6 mmHg (ref 44.0–60.0)
pCO2, Ven: 47.7 mmHg (ref 44.0–60.0)
pCO2, Ven: 47.8 mmHg (ref 44.0–60.0)
pCO2, Ven: 39.5 mmHg — ABNORMAL LOW (ref 44.0–60.0)
pCO2, Ven: 41.2 mmHg — ABNORMAL LOW (ref 44.0–60.0)
pCO2, Ven: 41.3 mmHg — ABNORMAL LOW (ref 44.0–60.0)
pCO2, Ven: 42 mmHg — ABNORMAL LOW (ref 44.0–60.0)
pCO2, Ven: 43.5 mmHg — ABNORMAL LOW (ref 44.0–60.0)
pH, Ven: 7.351 (ref 7.250–7.430)
pH, Ven: 7.353 (ref 7.250–7.430)
pH, Ven: 7.356 (ref 7.250–7.430)
pH, Ven: 7.362 (ref 7.250–7.430)
pH, Ven: 7.369 (ref 7.250–7.430)
pH, Ven: 7.382 (ref 7.250–7.430)
pH, Ven: 7.391 (ref 7.250–7.430)
pH, Ven: 7.403 (ref 7.250–7.430)
pH, Ven: 7.408 (ref 7.250–7.430)
pO2, Ven: 50 mmHg — ABNORMAL HIGH (ref 32.0–45.0)
pO2, Ven: 52 mmHg — ABNORMAL HIGH (ref 32.0–45.0)
pO2, Ven: 52 mmHg — ABNORMAL HIGH (ref 32.0–45.0)
pO2, Ven: 52 mmHg — ABNORMAL HIGH (ref 32.0–45.0)
pO2, Ven: 50 mmHg — ABNORMAL HIGH (ref 32.0–45.0)
pO2, Ven: 51 mmHg — ABNORMAL HIGH (ref 32.0–45.0)
pO2, Ven: 55 mmHg — ABNORMAL HIGH (ref 32.0–45.0)
pO2, Ven: 55 mmHg — ABNORMAL HIGH (ref 32.0–45.0)
pO2, Ven: 55 mmHg — ABNORMAL HIGH (ref 32.0–45.0)

## 2019-03-30 ENCOUNTER — Telehealth: Payer: Self-pay | Admitting: Cardiovascular Disease

## 2019-03-30 NOTE — Telephone Encounter (Signed)
Call daughter Eber Jones Batts-[phone]/ my chart/ consent/ pre reg completed

## 2019-03-31 ENCOUNTER — Telehealth (INDEPENDENT_AMBULATORY_CARE_PROVIDER_SITE_OTHER): Payer: Medicare Other | Admitting: Cardiovascular Disease

## 2019-03-31 ENCOUNTER — Encounter: Payer: Self-pay | Admitting: Cardiovascular Disease

## 2019-03-31 DIAGNOSIS — R600 Localized edema: Secondary | ICD-10-CM

## 2019-03-31 DIAGNOSIS — G4733 Obstructive sleep apnea (adult) (pediatric): Secondary | ICD-10-CM | POA: Diagnosis not present

## 2019-03-31 DIAGNOSIS — R0602 Shortness of breath: Secondary | ICD-10-CM

## 2019-03-31 DIAGNOSIS — Z9989 Dependence on other enabling machines and devices: Secondary | ICD-10-CM

## 2019-03-31 DIAGNOSIS — I10 Essential (primary) hypertension: Secondary | ICD-10-CM

## 2019-03-31 NOTE — Patient Instructions (Addendum)
Medication Instructions:  Your physician recommends that you continue on your current medications as directed. Please refer to the Current Medication list given to you today.  If you need a refill on your cardiac medications before your next appointment, please call your pharmacy.   Lab work: NONE  If you have labs (blood work) drawn today and your tests are completely normal, you will receive your results only by: Marland Kitchen MyChart Message (if you have MyChart) OR . A paper copy in the mail If you have any lab test that is abnormal or we need to change your treatment, we will call you to review the results.  Testing/Procedures: Your physician has requested that you have an echocardiogram. Echocardiography is a painless test that uses sound waves to create images of your heart. It provides your doctor with information about the size and shape of your heart and how well your heart's chambers and valves are working. This procedure takes approximately one hour. There are no restrictions for this procedure. [facility]  [address] WHEN COVID 19 RESTRICTIONS LISTED OFFICE WILL CALL WITH APPOINTMENT   Follow-Up: At [facility], you and your health needs are our priority.  As part of our continuing mission to provide you with exceptional heart care, we have created designated Provider Care Teams.  These Care Teams include your primary Cardiologist (physician) and Advanced Practice Providers (APPs -  Physician Assistants and Nurse Practitioners) who all work together to provide you with the care you need, when you need it. You will need a follow up appointment in 6 months.  Please call our office 2 months in advance to schedule this appointment.  You may see Chilton Si, MD or one of the following Advanced Practice Providers on your designated Care Team:   Corine Shelter, PA-C Judy Pimple, [place] . Marjie Skiff, PA-C  Any Other Special Instructions Will Be Listed Below (If  Applicable).  Track BP daily for 2 weeks and call with results   Echocardiogram An echocardiogram is a procedure that uses painless sound waves (ultrasound) to produce an image of the heart. Images from an echocardiogram can provide important information about:  Signs of coronary artery disease (CAD).  Aneurysm detection. An aneurysm is a weak or damaged part of an artery wall that bulges out from the normal force of blood pumping through the body.  Heart size and shape. Changes in the size or shape of the heart can be associated with certain conditions, including heart failure, aneurysm, and CAD.  Heart muscle function.  Heart valve function.  Signs of a past heart attack.  Fluid buildup around the heart.  Thickening of the heart muscle.  A tumor or infectious growth around the heart valves. Tell a health care provider about:  Any allergies you have.  All medicines you are taking, including vitamins, herbs, eye drops, creams, and over-the-counter medicines.  Any blood disorders you have.  Any surgeries you have had.  Any medical conditions you have.  Whether you are pregnant or may be pregnant. What are the risks? Generally, this is a safe procedure. However, problems may occur, including:  Allergic reaction to dye (contrast) that may be used during the procedure. What happens before the procedure? No specific preparation is needed. You may eat and drink normally. What happens during the procedure?   An IV tube may be inserted into one of your veins.  You may receive contrast through this tube. A contrast is an injection that improves  the quality of the pictures from your heart.  A gel will be applied to your chest.  A wand-like tool (transducer) will be moved over your chest. The gel will help to transmit the sound waves from the transducer.  The sound waves will harmlessly bounce off of your heart to allow the heart images to be captured in real-time motion.  The images will be recorded on a computer. The procedure may vary among health care providers and hospitals. What happens after the procedure?  You may return to your normal, everyday life, including diet, activities, and medicines, unless your health care provider tells you not to do that. Summary  An echocardiogram is a procedure that uses painless sound waves (ultrasound) to produce an image of the heart.  Images from an echocardiogram can provide important information about the size and shape of your heart, heart muscle function, heart valve function, and fluid buildup around your heart.  You do not need to do anything to prepare before this procedure. You may eat and drink normally.  After the echocardiogram is completed, you may return to your normal, everyday life, unless your health care provider tells you not to do that. This information is not intended to replace advice given to you by your health care provider. Make sure you discuss any questions you have with your health care provider. Document Released: 11/15/2000 Document Revised: 12/21/2016 Document Reviewed: 12/21/2016 Elsevier Interactive Patient Education  2019 [employer].

## 2019-03-31 NOTE — Progress Notes (Signed)
Virtual Visit via Video Note   This visit type was conducted due to national recommendations for restrictions regarding the COVID-19 Pandemic (e.g. social distancing) in an effort to limit this patient's exposure and mitigate transmission in our community.  Due to her co-morbid illnesses, this patient is at least at moderate risk for complications without adequate follow up.  This format is felt to be most appropriate for this patient at this time.  All issues noted in this document were discussed and addressed.  A limited physical exam was performed with this format.  Please refer to the patient's chart for her consent to telehealth for [facility].   Evaluation Performed:  Follow-up visit  Date:  03/31/2019   ID:  Tina Long, DOB June 14, 1928, MRN [id number]  Patient Location: Home Provider Location: Office  PCP:  Heloise Ochoa, PA-C  Cardiologist:  Chilton Si, MD  Electrophysiologist:  None   Chief Complaint:  Shortness of breath  History of Present Illness:    Tina Long is a 85 y.o. female with hypertension, OSA and GERD here for follow up. She was initially seen 03/17/19 for the evaluation of shortness of breath.  Tina Long reported increasing exertional dyspnea and chest pressure.  It was associated with nausea and mild lower extremity edema.  Her family noticed that she was very short of breath after walking to the mailbox and back.  [name] was first seen in urgent care on 4/14 due to shortness of breath x1 month.   D-dimer was elevated to 3.88 so she was referred to the ED.  In the ED she had negative L LE Dopplers.  She also had a chest CT that showed diffuse calcification of the coronary arteries but no PE. CXR was suggestive of mild cardiomegaly.  There was no pulmonary edema or infiltrate.  She was noted to have atherosclerosis of the aorta.  She was referred for cardiac catheterization  03/2019 and was found to have 20% RCA disease.  LVEF was greater than 65%.   There was near mid cavity obliteration on left ventriculogram.  An echocardiogram was recommended.  Since her cath Ms. [name] has been feeling well.  She has started increasing her exercise.  She notes that her breathing has improved.  She walks around the house daily and has been riding an exercise bike 3 days/week for 20 minutes.  She still gets tired but notices a improvement in her stamina.  She has not experienced any chest pressure.  She denies lower extremity edema, orthopnea, or PND.  She notes that her blood pressure is somewhat labile.  It is sometimes as low as the 90s in the mornings and at other times goes into the 140s or 150s.  The patient does not have symptoms concerning for COVID-19 infection (fever, chills, cough, or new shortness of breath).    Past Medical History:  Diagnosis Date  . Arthritis   . Diabetes mellitus without complication (HCC)    borderline   . GERD (gastroesophageal reflux disease)    hx of   . Hypertension   . OSA on CPAP 03/17/2019  . Sleep apnea    cpap   . Unstable angina (HCC) 03/17/2019  . Urinary urgency    Past Surgical History:  Procedure Laterality Date  . ABDOMINAL HYSTERECTOMY    . APPENDECTOMY    . EYE SURGERY     bilateral cataracts with lens implants  . RIGHT/LEFT HEART CATH AND CORONARY ANGIOGRAPHY N/A 03/18/2019   Procedure:  RIGHT/LEFT HEART CATH AND CORONARY ANGIOGRAPHY;  Surgeon: Lennette Bihari, MD;  Location[facility] INVASIVE CV LAB;  Service: Cardiovascular;  Laterality: N/A;  . TOTAL KNEE ARTHROPLASTY Right 07/19/2015   Procedure: RIGHT TOTAL KNEE ARTHROPLASTY;  Surgeon: Ollen Gross, MD;  Location: [facility] ORS;  Service: Orthopedics;  Laterality: Right;     Current Meds  Medication Sig  . hydrochlorothiazide (HYDRODIURIL) 25 MG tablet Take 25 mg by mouth daily.  Marland Kitchen losartan (COZAAR) 100 MG tablet Take 100 mg by mouth daily.   . metoprolol tartrate (LOPRESSOR) 25 MG tablet Take 12.5 mg by mouth 2 (two) times daily.   . predniSONE  (DELTASONE) 5 MG tablet Take 10 tablets by mouth daily.  Marland Kitchen UNABLE TO FIND C-Pap     Allergies:   Morphine and related and Tramadol   Social History   Tobacco Use  . Smoking status: Never Smoker  . Smokeless tobacco: Never Used  Substance Use Topics  . Alcohol use: No  . Drug use: No     Family Hx: The patient's family history is not on file.  ROS:   Please see the history of present illness.     All other systems reviewed and are negative.   Prior CV studies:   The following studies were reviewed today:  LHC/RHC 03/18/19:  Prox RCA lesion is 20% stenosed.  There is hyperdynamic left ventricular systolic function.  LV end diastolic pressure is normal.  The left ventricular ejection fraction is greater than 65% by visual estimate.   Hyperdynamic LV function with an ejection fraction greater than 65%, left ventricular hypertrophy, with near mid cavity obliteration.  No significant coronary obstructive disease with mild calcification noted in the proximal RCA with smooth 20% narrowing.  There is a normal-appearing LAD with a small  Intramyocardial mid LAD segment without systolic bridging and normal left circumflex coronary artery.  Mild right heart pressure elevation with mild pulmonary hypertension.  RECOMMENDATION: Medical therapy for minimal CAD.  Recommend echo Doppler evaluation.  RA A wave 11, V wave 8; mean 6 RV: 40/5 PA: 41/13; mean 26 PW mean 23; V wave 25 Initial AO 167/68 Initial LV 168/14   Labs/Other Tests and Data Reviewed:    EKG:  n/a  Recent Labs: 03/16/2019: ALT 10; B Natriuretic Peptide 204.6; BUN 21; Creat 1.10 03/18/2019: Hemoglobin 10.5; Potassium 3.4; Sodium 138   Recent Lipid Panel No results found for: CHOL, TRIG, HDL, CHOLHDL, LDLCALC, LDLDIRECT  Wt Readings from Last 3 Encounters:  03/31/19 238 lb (108 kg)  03/18/19 238 lb (108 kg)  03/17/19 239 lb (108.4 kg)     Objective:    BP (!) 148/66   Pulse (!) 58   Ht 5\' 5"   (1.651 m)   Wt 238 lb (108 kg)   BMI 39.61 kg/m  GENERAL: Well-appearing.  No acute distress. HEENT: Pupils equal round.  Oral mucosa unremarkable NECK:  No jugular venous distention, no visible thyromegaly EXT:  No edema, no cyanosis no clubbing SKIN:  No rashes no nodules NEURO:  Speech fluent.  Cranial nerves grossly intact.  Moves all 4 extremities freely PSYCH:  Cognitively intact, oriented to person place and time   ASSESSMENT & PLAN:    #Shortness of breath: Chest CT showed coronary calcium in the LAD and RCA distributions.  However on cath she was only found to have 20% RCA disease.  Her symptoms have improved with exercise.  I encouraged her to continue her exercise and increase as tolerated.  Given  that she had hyperdynamic left ventricular function with near cavity obliteration we will plan to get an echocardiogram once COVID-19 imaging restrictions have been lifted.  Right atrial pressure was 6 on right heart cath.  No plans for diuretics.  Lower extremity edema is probably more related to venous insufficiency.  # LE Edema: BNP mildly elevated.  No pulmonary edema but she does have LE edema on exam.  LE Dopplers were negative.  Right atrial pressure was 6 on right heart cath.  No plans for diuretics.  Lower extremity edema is probably more related to venous insufficiency.  # Hypertension: BP has been labile.  Continue HCTZ, losartan and metoprolol.  She will track her blood pressure for 2 weeks and call with results.  The goal is less than 130/80.  # Coronary/aorta calcification: Check lipids/CMP at follow up.    COVID-19 Education: The signs and symptoms of COVID-19 were discussed with the patient and how to seek care for testing (follow up with PCP or arrange E-visit).  The importance of social distancing was discussed today.  Time:   Today, I have spent 18 minutes with the patient with telehealth technology discussing the above problems.     Medication  Adjustments/Labs and Tests Ordered: Current medicines are reviewed at length with the patient today.  Concerns regarding medicines are outlined above.   Tests Ordered: Orders Placed This Encounter  Procedures  . ECHOCARDIOGRAM COMPLETE    Medication Changes: No orders of the defined types were placed in this encounter.   Disposition:  Follow up in 6 month(s)  Signed, Chilton Si, MD  03/31/2019 11:17 AM    [facility]

## 2019-04-13 ENCOUNTER — Telehealth: Payer: Self-pay | Admitting: Cardiovascular Disease

## 2019-04-13 NOTE — Telephone Encounter (Signed)
New Message           Patient's daughter is calling to speak with Dr. Duke Salvia about her mother's bp. Pls call to advise.

## 2019-04-13 NOTE — Telephone Encounter (Signed)
Wanted to let Dr.[place] and let her know what they were.   Mostly checked around 9:00 AM for the others as listed below.  4/30- 153/63 HR 63 05/01- 154/69 HR 56 05/02- 146/68 HR 59 05/04- 167/62 HR 61 05/05- 152/61 HR 58 05/06- 135/56 HR 59 05/07- 163/65 HR 60 05/08- 155/64 HR 58 05/09- 134/55 HR 56 05/10- 174/68 HR 56 @ 11:15 AM, 150/67 HR 59 @ 2:40 PM 05/11- 175/65 HR 59 @ 9:00 AM, 180/64 HR 65 @ 5:00 PM, 168/59 HR 74 @ 7:05 PM 05/12- 139/62 HR 58

## 2019-04-15 NOTE — Telephone Encounter (Signed)
BP is too high and HR is on the low side.  Recommend stopping metoprolol and starting amlodipine 5mg  daily.  Keep tracking BP and f/u with PharmD in 1 month.

## 2019-04-16 MED ORDER — AMLODIPINE BESYLATE 5 MG PO TABS: 5.0000 mg | ORAL_TABLET | Freq: Every day | ORAL | 3 refills | Status: DC

## 2019-04-16 NOTE — Telephone Encounter (Signed)
Advised patient, verbalized understanding.   Patient wanted to know if she is supposed to be taking an ASA daily. Advised patient yes take ASA 81 mg daily, verbalized understanding

## 2019-04-29 ENCOUNTER — Telehealth (HOSPITAL_COMMUNITY): Payer: Self-pay

## 2019-04-29 NOTE — Telephone Encounter (Signed)
LMTCB COVID prescreening for echo. 

## 2019-04-30 ENCOUNTER — Ambulatory Visit (HOSPITAL_COMMUNITY): Payer: Medicare Other | Attending: Internal Medicine

## 2019-04-30 ENCOUNTER — Other Ambulatory Visit: Payer: Self-pay

## 2019-04-30 DIAGNOSIS — R0602 Shortness of breath: Secondary | ICD-10-CM | POA: Insufficient documentation

## 2019-05-18 ENCOUNTER — Telehealth: Payer: Self-pay | Admitting: Pharmacist

## 2019-05-18 NOTE — Telephone Encounter (Signed)
*  Telephone follow-up*  HPI: Tina Long is a 85 y.o. female with PMH relevant for hypertension,   Denies SOB,   Current HTN meds:  Amlodipine 5mg  daily HCTZ 25mg  daily Losartan 100mg  daily  BP goal: 130/80  Diet: take-out Chinese 2-3 times per month  Exercise: ride bicycle and squads - daily  Home BP readings:  134/55 (56) 174/68 (56) 150/67 (59) 175/65 (59) 139/62 (58) 145/71 (66) 151/63 (70) 129/65 (93)  Average BP readings: 149/64   Assessment and Plan:   Blood pressure remains stable and above goal range. Noted systolic BP as low as 262M and 139 , but some readings in 150s and 170s. Noted patient not compliance with low sodium diet but exercising daily as recommended. Will continue current BP regimen, work on sodium restriction, and follow up face to face appointment in 2 months.   [name] PharmD, BCPS, [place] [address] [place], [address] 05/18/2019 12:12 PM

## 2019-08-02 ENCOUNTER — Institutional Professional Consult (permissible substitution): Payer: Medicare Other | Admitting: Pulmonary Disease

## 2019-08-04 ENCOUNTER — Telehealth: Payer: Self-pay | Admitting: Cardiovascular Disease

## 2019-08-04 ENCOUNTER — Ambulatory Visit (INDEPENDENT_AMBULATORY_CARE_PROVIDER_SITE_OTHER): Payer: Medicare Other | Admitting: Pharmacist Clinician (PhC)/ Clinical Pharmacy Specialist

## 2019-08-04 ENCOUNTER — Ambulatory Visit (INDEPENDENT_AMBULATORY_CARE_PROVIDER_SITE_OTHER): Payer: Medicare Other | Admitting: Pulmonary Disease

## 2019-08-04 ENCOUNTER — Encounter: Payer: Self-pay | Admitting: Pulmonary Disease

## 2019-08-04 ENCOUNTER — Other Ambulatory Visit: Payer: Self-pay

## 2019-08-04 VITALS — BP 162/70 | HR 81 | Resp 14 | Ht 65.0 in | Wt 242.8 lb

## 2019-08-04 DIAGNOSIS — J849 Interstitial pulmonary disease, unspecified: Secondary | ICD-10-CM | POA: Diagnosis not present

## 2019-08-04 DIAGNOSIS — I10 Essential (primary) hypertension: Secondary | ICD-10-CM

## 2019-08-04 MED ORDER — HYDROCHLOROTHIAZIDE 25 MG PO TABS: 25.0000 mg | ORAL_TABLET | Freq: Every day | ORAL | 3 refills | Status: DC

## 2019-08-04 NOTE — Patient Instructions (Addendum)
We will evaluate you for presence of interstitial lung disease as well to make sure that the rheumatoid arthritis is not affecting your lung We will schedule you for pulmonary function testing high-resolution CT for evaluation   Follow-up in 1 month.

## 2019-08-04 NOTE — Telephone Encounter (Signed)
rx sent

## 2019-08-04 NOTE — Telephone Encounter (Signed)
Pt's daughter calling stating that her mother was prescribed blood pressure medication but it has not been sent to pt's pharmacy. Daughter would like a call back concerning this matter at [phone]. Please address

## 2019-08-04 NOTE — Progress Notes (Signed)
08/16/2019 Tina ArnoldOmica D [place] 1928-06-04 [id number]   HPI:  Tina Long is a 85 y.o. female patient of Dr Duke Salviaandolph, with a PMH below who presents today for hypertension clinic evaluation.  In addition to hypertension, her medical history is significant for unstable angina, OSA (with CPAP) and lower extremity edema.    Patient saw Dr. Duke Salviaandolph via telemedicine in April and was noted to have labile blood pressures, however no medications were changed.  She was asked to continue home monitoring and followed up with CVRR in a telephone visit 6 weeks alter.  At that time she admitted to eating a varied sodium diet, but was doing well with recommended exercise.  Her home pressures were still varied and she was asked to work on cutting back sodium.  She returns today for an in office visit.      Blood Pressure Goal:  130/80  Current Medications: amlodipine 5 mg qd, losartan 100 mg qd,  Family Hx: mother with hypertension, died at 4199; father died at 7770 3 children all with hypertension;   Social Hx:   No tobacco, no alcohol, 1-2 cups of coffee per day  Diet:  Cooking most meals at home, ate out 3 times in past 2 weeks, had Congohinese  Exercise:  Does daily chair exercise, daughter notes she is easily distracted by other things when doing this  Home BP readings:  New home cuff, about 651 month old.  Has  readings with her today.  Range of 124-171/WNL,  Average 151/65.  HR average 65  Intolerances: no cardiac medication issues  Labs:  Wt Readings from Last 3 Encounters:  08/04/19 241 lb (109.3 kg)  08/04/19 242 lb 12.8 oz (110.1 kg)  03/31/19 238 lb (108 kg)   BP Readings from Last 3 Encounters:  08/04/19 136/74  08/04/19 (!) 162/70  03/31/19 (!) 148/66   Pulse Readings from Last 3 Encounters:  08/04/19 (!) 58  08/04/19 81  03/31/19 (!) 58    Current Outpatient Medications  Medication Sig Dispense Refill  . amLODipine (NORVASC) 5 MG tablet Take 1 tablet (5 mg total) by mouth  daily. 90 tablet 3  . aspirin EC 81 MG tablet Take 81 mg by mouth daily.    Marland Kitchen. losartan (COZAAR) 100 MG tablet Take 100 mg by mouth daily.     . predniSONE (DELTASONE) 5 MG tablet Take 5 mg by mouth daily.     Marland Kitchen. UNABLE TO FIND C-Pap    . hydrochlorothiazide (HYDRODIURIL) 25 MG tablet Take 1 tablet (25 mg total) by mouth daily. 90 tablet 3   No current facility-administered medications for this visit.     Allergies  Allergen Reactions  . Morphine And Related Itching  . Tramadol Itching    Sedation, voice changed.     Past Medical History:  Diagnosis Date  . Arthritis   . Diabetes mellitus without complication (HCC)    borderline   . GERD (gastroesophageal reflux disease)    hx of   . Hypertension   . OSA on CPAP 03/17/2019  . Sleep apnea    cpap   . Unstable angina (HCC) 03/17/2019  . Urinary urgency     Blood pressure (!) 162/70, pulse 81, resp. rate 14, height 5\' 5"  (1.651 m), weight 242 lb 12.8 oz (110.1 kg), SpO2 99 %.   Hypertension Patient with uncontrolled hypertension currently taking losartan 100 mg and amlodipine 5 mg daily.  Will have her move amlodipine dose to evenings and  add hctz 25 mg daily with her morning losartan.  She should continue to check home BP readings daily and return for follow up in 1 month.  She should also follow up with BMET in 2 weeks.     Tommy Medal PharmD CPP [facility] [address] [place], [place] [address] [phone]

## 2019-08-04 NOTE — Patient Instructions (Addendum)
Return for a a follow up appointment on Oct 13  Go to the lab in 2 weeks  Your blood pressure today is 142/58  Check your blood pressure at home daily and keep record of the readings.  Take your BP meds as follows:  AM:  Losartan 100 mg, hydrochlorothiazide 25 mg  PM:  Amlodipine 5 mg  (start this Thursday Sept 3)   Bring all of your meds, your BP cuff and your record of home blood pressures to your next appointment.  Exercise as you're able, try to walk approximately 30 minutes per day.  Keep salt intake to a minimum, especially watch canned and prepared boxed foods.  Eat more fresh fruits and vegetables and fewer canned items.  Avoid eating in fast food restaurants.    HOW TO TAKE YOUR BLOOD PRESSURE: . Rest 5 minutes before taking your blood pressure. .  Don't smoke or drink caffeinated beverages for at least 30 minutes before. . Take your blood pressure before (not after) you eat. . Sit comfortably with your back supported and both feet on the floor (don't cross your legs). . Elevate your arm to heart level on a table or a desk. . Use the proper sized cuff. It should fit smoothly and snugly around your bare upper arm. There should be enough room to slip a fingertip under the cuff. The bottom edge of the cuff should be 1 inch above the crease of the elbow. . Ideally, take 3 measurements at one sitting and record the average.  \

## 2019-08-05 NOTE — Progress Notes (Signed)
Tina ArnoldOmica D Long    [id number]    10-Jan-1928  Primary Care Physician:Parks, Peggie, PA-C  Referring Physician: Heloise OchoaParks, Peggie, PA-C [address] [place],  [place] [id number]  Chief complaint: Consult for interstitial lung disease HPI: 85 year old with history of rheumatoid arthritis, osteoporosis, hypertension, dementia.  Diagnosed with rheumatoid arthritis around 2016.  [place] [facility] but also [facility] where her daughter is located.  She has been maintained on Remicade, prednisone and Plaquenil for many years.  Follows with Dr. Zenovia JordanAngela Hawkes, [facility].  Apparently [facility] which showed interstitial lung disease and has been referred here for further evaluation.  She has chronic dyspnea on exertion.  Denies any cough, sputum production, fevers, chills.  Pets: No pets Occupation: Used to work as a LawyerCNA Exposures: No known exposures.  No mold, hot tub, Jacuzzi Smoking history: Never smoker Travel history: No significant travel history Relevant family history: No significant family history of lung disease  Outpatient Encounter Medications as of 08/04/2019  Medication Sig  . amLODipine (NORVASC) 5 MG tablet Take 1 tablet (5 mg total) by mouth daily.  Marland Kitchen. aspirin EC 81 MG tablet Take 81 mg by mouth daily.  Marland Kitchen. losartan (COZAAR) 100 MG tablet Take 100 mg by mouth daily.   . predniSONE (DELTASONE) 5 MG tablet Take 5 mg by mouth daily.   Marland Kitchen. UNABLE TO FIND C-Pap  . [DISCONTINUED] hydrochlorothiazide (HYDRODIURIL) 25 MG tablet Take 25 mg by mouth daily.   No facility-administered encounter medications on file as of 08/04/2019.     Allergies as of 08/04/2019 - Review Complete 08/04/2019  Allergen Reaction Noted  . Morphine and related Itching 04/07/2015  . Tramadol Itching 07/17/2015    Past Medical History:  Diagnosis Date  . Arthritis   . Diabetes mellitus without complication (HCC)    borderline   .  GERD (gastroesophageal reflux disease)    hx of   . Hypertension   . OSA on CPAP 03/17/2019  . Sleep apnea    cpap   . Unstable angina (HCC) 03/17/2019  . Urinary urgency     Past Surgical History:  Procedure Laterality Date  . ABDOMINAL HYSTERECTOMY    . APPENDECTOMY    . EYE SURGERY     bilateral cataracts with lens implants  . RIGHT/LEFT HEART CATH AND CORONARY ANGIOGRAPHY N/A 03/18/2019   Procedure: RIGHT/LEFT HEART CATH AND CORONARY ANGIOGRAPHY;  Surgeon: Lennette BihariKelly, Thomas A, MD;  Location: [facility] INVASIVE CV LAB;  Service: Cardiovascular;  Laterality: N/A;  . TOTAL KNEE ARTHROPLASTY Right 07/19/2015   Procedure: RIGHT TOTAL KNEE ARTHROPLASTY;  Surgeon: Ollen GrossFrank Aluisio, MD;  Location: [facility] ORS;  Service: Orthopedics;  Laterality: Right;    No family history on file.  Social History   Socioeconomic History  . Marital status: Widowed    Spouse name: Not on file  . Number of children: Not on file  . Years of education: Not on file  . Highest education level: Not on file  Occupational History  . Not on file  Social Needs  . Financial resource strain: Not on file  . Food insecurity    Worry: Not on file    Inability: Not on file  . Transportation needs    Medical: Not on file    Non-medical: Not on file  Tobacco Use  . Smoking status: Never Smoker  . Smokeless tobacco: Never Used  Substance and Sexual  Activity  . Alcohol use: No  . Drug use: No  . Sexual activity: Not on file  Lifestyle  . Physical activity    Days per week: Not on file    Minutes per session: Not on file  . Stress: Not on file  Relationships  . Social Herbalist on phone: Not on file    Gets together: Not on file    Attends religious service: Not on file    Active member of club or organization: Not on file    Attends meetings of clubs or organizations: Not on file    Relationship status: Not on file  . Intimate partner violence    Fear of current or ex partner: Not on file    Emotionally  abused: Not on file    Physically abused: Not on file    Forced sexual activity: Not on file  Other Topics Concern  . Not on file  Social History Narrative  . Not on file    Review of systems: Review of Systems  Constitutional: Negative for fever and chills.  HENT: Negative.   Eyes: Negative for blurred vision.  Respiratory: as per HPI  Cardiovascular: Negative for chest pain and palpitations.  Gastrointestinal: Negative for vomiting, diarrhea, blood per rectum. Genitourinary: Negative for dysuria, urgency, frequency and hematuria.  Musculoskeletal: Negative for myalgias, back pain and joint pain.  Skin: Negative for itching and rash.  Neurological: Negative for dizziness, tremors, focal weakness, seizures and loss of consciousness.  Endo/Heme/Allergies: Negative for environmental allergies.  Psychiatric/Behavioral: Negative for depression, suicidal ideas and hallucinations.  All other systems reviewed and are negative.  Physical Exam: Blood pressure 136/74, pulse (!) 58, temperature 97.8 F (36.6 C), height 5\' 5"  (1.651 m), weight 241 lb (109.3 kg), SpO2 98 %. Gen:      No acute distress HEENT:  EOMI, sclera anicteric Neck:     No masses; no thyromegaly Lungs:    Clear to auscultation bilaterally; normal respiratory effort CV:         Regular rate and rhythm; no murmurs Abd:      + bowel sounds; soft, non-tender; no palpable masses, no distension Ext:    No edema; adequate peripheral perfusion Skin:      Warm and dry; no rash Neuro: alert and oriented x 3 Psych: normal mood and affect  Data Reviewed: Imaging: CTA 03/16/2019- no pulmonary emboli, mild dependent atelectatic changes.  I have reviewed the images personally.    Assessment:  Evaluation for interstitial lung disease She has history of longstanding rheumatoid arthritis.  I have reviewed the available imaging and CT angiogram in March 2020 shows dependent atelectasis.  It is unclear if this is early fibrosis. We  will get a high-resolution CT and pulmonary function test for further evaluation Follow-up in 2 to 4 weeks  Plan/Recommendations: - High-res CT, PFTs  Marshell Garfinkel MD [facility] 08/05/2019, 1:50 PM  CC: Lajuana Matte, PA-C

## 2019-08-16 ENCOUNTER — Encounter: Payer: Self-pay | Admitting: Pharmacist Clinician (PhC)/ Clinical Pharmacy Specialist

## 2019-08-16 NOTE — Assessment & Plan Note (Signed)
Patient with uncontrolled hypertension currently taking losartan 100 mg and amlodipine 5 mg daily.  Will have her move amlodipine dose to evenings and add hctz 25 mg daily with her morning losartan.  She should continue to check home BP readings daily and return for follow up in 1 month.  She should also follow up with BMET in 2 weeks.

## 2019-08-26 ENCOUNTER — Inpatient Hospital Stay: Admission: RE | Admit: 2019-08-26 | Payer: Medicare Other | Source: Ambulatory Visit

## 2019-09-10 ENCOUNTER — Telehealth: Payer: Self-pay | Admitting: Pulmonary Disease

## 2019-09-10 NOTE — Telephone Encounter (Signed)
FYI, pt was also contact and message left by [facility] on 08/27/2019-pr

## 2019-09-13 ENCOUNTER — Other Ambulatory Visit: Payer: Self-pay

## 2019-09-13 ENCOUNTER — Ambulatory Visit (INDEPENDENT_AMBULATORY_CARE_PROVIDER_SITE_OTHER)
Admission: RE | Admit: 2019-09-13 | Discharge: 2019-09-13 | Disposition: A | Discharge status: Home / Self Care | Payer: Medicare Other | Source: Ambulatory Visit | Attending: Pulmonary Disease | Admitting: Pulmonary Disease

## 2019-09-13 DIAGNOSIS — J849 Interstitial pulmonary disease, unspecified: Secondary | ICD-10-CM

## 2019-09-13 NOTE — Telephone Encounter (Signed)
Attempted to call patient, no answer, left message to call back.  

## 2019-09-13 NOTE — Telephone Encounter (Signed)
Spoke with the pt's daughter  I advised according to the chart pt did not have a PFT done  I advised that we could schedule her at the hospital for this  She refused bc she did not want to be charged more to have test done at the hospital  Wants to have PFT done here  First available OV with PFT scheduled here with Dr Vaughan Browner

## 2019-09-13 NOTE — Telephone Encounter (Signed)
Patient daughter Hoyle Sauer called - states that pt thinks she had pft done at [facility] this year - she can be reached at [phone]

## 2019-09-14 ENCOUNTER — Ambulatory Visit (INDEPENDENT_AMBULATORY_CARE_PROVIDER_SITE_OTHER): Payer: Medicare Other | Admitting: Pharmacist

## 2019-09-14 ENCOUNTER — Encounter

## 2019-09-14 VITALS — BP 144/56 | HR 62 | Ht 65.0 in | Wt 239.4 lb

## 2019-09-14 DIAGNOSIS — I10 Essential (primary) hypertension: Secondary | ICD-10-CM

## 2019-09-14 MED ORDER — AMLODIPINE BESYLATE 5 MG PO TABS: 5.0000 mg | ORAL_TABLET | Freq: Every evening | ORAL | 3 refills | Status: AC

## 2019-09-14 MED ORDER — LOSARTAN POTASSIUM 100 MG PO TABS: 100.0000 mg | ORAL_TABLET | Freq: Every day | ORAL | 1 refills | Status: AC

## 2019-09-14 MED ORDER — HYDROCHLOROTHIAZIDE 25 MG PO TABS: 12.5000 mg | ORAL_TABLET | Freq: Every day | ORAL | 3 refills | Status: DC

## 2019-09-14 NOTE — Progress Notes (Signed)
HPI:  Tina Long is a 85 y.o. female patient of Dr Oval Linsey, with a [place] below who presents today for hypertension clinic follow up.  In addition to hypertension, her medical history is significant for unstable angina, OSA (with CPAP) and lower extremity edema.    During the last OV with hypertension clinic we moved her amlodipine to evening administration and HCTZ was added to therapy. Follow up BMET was ordered but not completed as instructed. Patient was to work on positive lifestyle modification as well especially on decreasing daily sodium intake.   She presents today accompany by her daughter and reports some leg cramps but denies shortness of breath or headaches. She is currently taking HCTZ 1/2 tab in AM and 1/2 in PM but taking amlodipine 5mg  in the mornings.   Blood Pressure Goal:  130/80  Current Medications: Amlodipine 5mg  daily - morning HCTZ 25mg  daily - 1/2 tab in and and 1/2 tab in PM Losartan 100mg  daily - morning  Family Hx: mother with hypertension, died at 28; father died at 43 3 children all with hypertension;   Social Hx:   No tobacco, no alcohol, 1-2 cups of coffee per day  Diet:  Cooking most meals at home, ate out 3 times in past 2 weeks, had [place]  Exercise:  Does daily chair exercise,stationary bike and walking  Home BP readings:  18 readings; average 154/60  Intolerances: no cardiac medication issues  Wt Readings from Last 3 Encounters:  09/14/19 239 lb 6.4 oz (108.6 kg)  08/04/19 241 lb (109.3 kg)  08/04/19 242 lb 12.8 oz (110.1 kg)   BP Readings from Last 3 Encounters:  09/14/19 (!) 144/56  08/04/19 136/74  08/04/19 (!) 162/70   Pulse Readings from Last 3 Encounters:  09/14/19 62  08/04/19 (!) 58  08/04/19 81    Current Outpatient Medications  Medication Sig Dispense Refill  . amLODipine (NORVASC) 5 MG tablet Take 1 tablet (5 mg total) by mouth every evening. 90 tablet 3  . aspirin EC 81 MG tablet Take 81 mg by mouth daily.    .  hydrochlorothiazide (HYDRODIURIL) 25 MG tablet Take 0.5 tablets (12.5 mg total) by mouth daily. 90 tablet 3  . losartan (COZAAR) 100 MG tablet Take 1 tablet (100 mg total) by mouth daily. 90 tablet 1  . predniSONE (DELTASONE) 5 MG tablet Take 5 mg by mouth daily.     Marland Kitchen UNABLE TO FIND C-Pap     No current facility-administered medications for this visit.     Allergies  Allergen Reactions  . Morphine And Related Itching  . Tramadol Itching    Sedation, voice changed.     Past Medical History:  Diagnosis Date  . Arthritis   . Diabetes mellitus without complication (HCC)    borderline   . GERD (gastroesophageal reflux disease)    hx of   . Hypertension   . OSA on CPAP 03/17/2019  . Sleep apnea    cpap   . Unstable angina ([place]) 03/17/2019  . Urinary urgency     Blood pressure (!) 144/56, pulse 62, height 5\' 5"  (1.651 m), weight 239 lb 6.4 oz (108.6 kg), SpO2 98 %.   Hypertension Blood pressure remains above goal and reports increased urination and muscle cramps. We don't have recent BMET on since to assess electrolytes and hydration status.  Also noted patient started to eat more frozen meals ; therefore, significantly increased sodium intake.  Will decrease HCTZ to 12.5mg  every  day to improved cramps, and obtain BMET results from PCP. Patient will work on lifestyle modifications especially low sodium diet and improved hydration. Plan to change losartan 100mg  to valsartan 160mg  daily during next office visit if additional BP control needed.  Aristotle Lieb Rodriguez-Guzman PharmD, BCPS, CPP [facility] [address] [place] [address] 09/16/2019 2:30 PM

## 2019-09-14 NOTE — Patient Instructions (Addendum)
Return for a  follow up appointment in 4 weeks with DR Oval Linsey  Check your blood pressure at home daily (if able) and keep record of the readings.  Take your BP meds as follows: *DECREASE frozen meal in diet* *DECREASE hydrochlorothiazide to 12.5mg (1/2 tablet) every morning ONLY* *CHANGE amlodipine to 5mg  every evening with supper* *Continue taking losartan 100mg  daily in the mornings*  Bring all of your meds, your BP cuff and your record of home blood pressures to your next appointment.  Exercise as you're able, try to walk approximately 30 minutes per day.  Keep salt intake to a minimum, especially watch canned and prepared boxed foods.  Eat more fresh fruits and vegetables and fewer canned items.  Avoid eating in fast food restaurants.    HOW TO TAKE YOUR BLOOD PRESSURE: . Rest 5 minutes before taking your blood pressure. .  Don't smoke or drink caffeinated beverages for at least 30 minutes before. . Take your blood pressure before (not after) you eat. . Sit comfortably with your back supported and both feet on the floor (don't cross your legs). . Elevate your arm to heart level on a table or a desk. . Use the proper sized cuff. It should fit smoothly and snugly around your bare upper arm. There should be enough room to slip a fingertip under the cuff. The bottom edge of the cuff should be 1 inch above the crease of the elbow. . Ideally, take 3 measurements at one sitting and record the average.

## 2019-09-15 ENCOUNTER — Ambulatory Visit: Payer: Medicare Other | Admitting: Pulmonary Disease

## 2019-09-16 ENCOUNTER — Encounter: Payer: Self-pay | Admitting: Pharmacist

## 2019-09-16 NOTE — Assessment & Plan Note (Addendum)
Blood pressure remains above goal and reports increased urination and muscle cramps. We don't have recent BMET on since to assess electrolytes and hydration status.  Also noted patient started to eat more frozen meals ; therefore, significantly increased sodium intake.  Will decrease HCTZ to 12.5mg  every day to improved cramps, and obtain BMET results from PCP. Patient will work on lifestyle modifications especially low sodium diet and improved hydration. Plan to change losartan 100mg  to valsartan 160mg  daily during next office visit if additional BP control needed.

## 2019-09-22 ENCOUNTER — Telehealth: Payer: Self-pay | Admitting: Pulmonary Disease

## 2019-09-22 ENCOUNTER — Ambulatory Visit: Payer: Medicare Other | Admitting: Pulmonary Disease

## 2019-09-22 NOTE — Telephone Encounter (Signed)
Called and spoke w/ pt. Pt states she would like a copy of her CT Chest high res 09/13/2019 sent to her via mail. After verifying her address, I let her know we would send this for her. Pt verbalized understanding with no additional questions. CT Chest high res 09/13/2019 has been printed and mailed to pt per request. Nothing further needed at this time.

## 2019-10-21 ENCOUNTER — Ambulatory Visit: Payer: Medicare Other | Admitting: Cardiovascular Disease

## 2019-10-26 ENCOUNTER — Ambulatory Visit: Payer: Medicare Other | Admitting: Cardiovascular Disease

## 2019-11-15 ENCOUNTER — Ambulatory Visit: Payer: Medicare Other | Admitting: Pulmonary Disease

## 2019-11-17 ENCOUNTER — Ambulatory Visit: Payer: Medicare Other | Admitting: Cardiovascular Disease

## 2020-03-24 IMAGING — DX CHEST - 2 VIEW
2 series · 2 of 2 positions shown · non-contrast
Comparison: None.

CLINICAL DATA: Shortness of breath.  Hypertension.

EXAM:
CHEST - 2 VIEW

[chest pa]
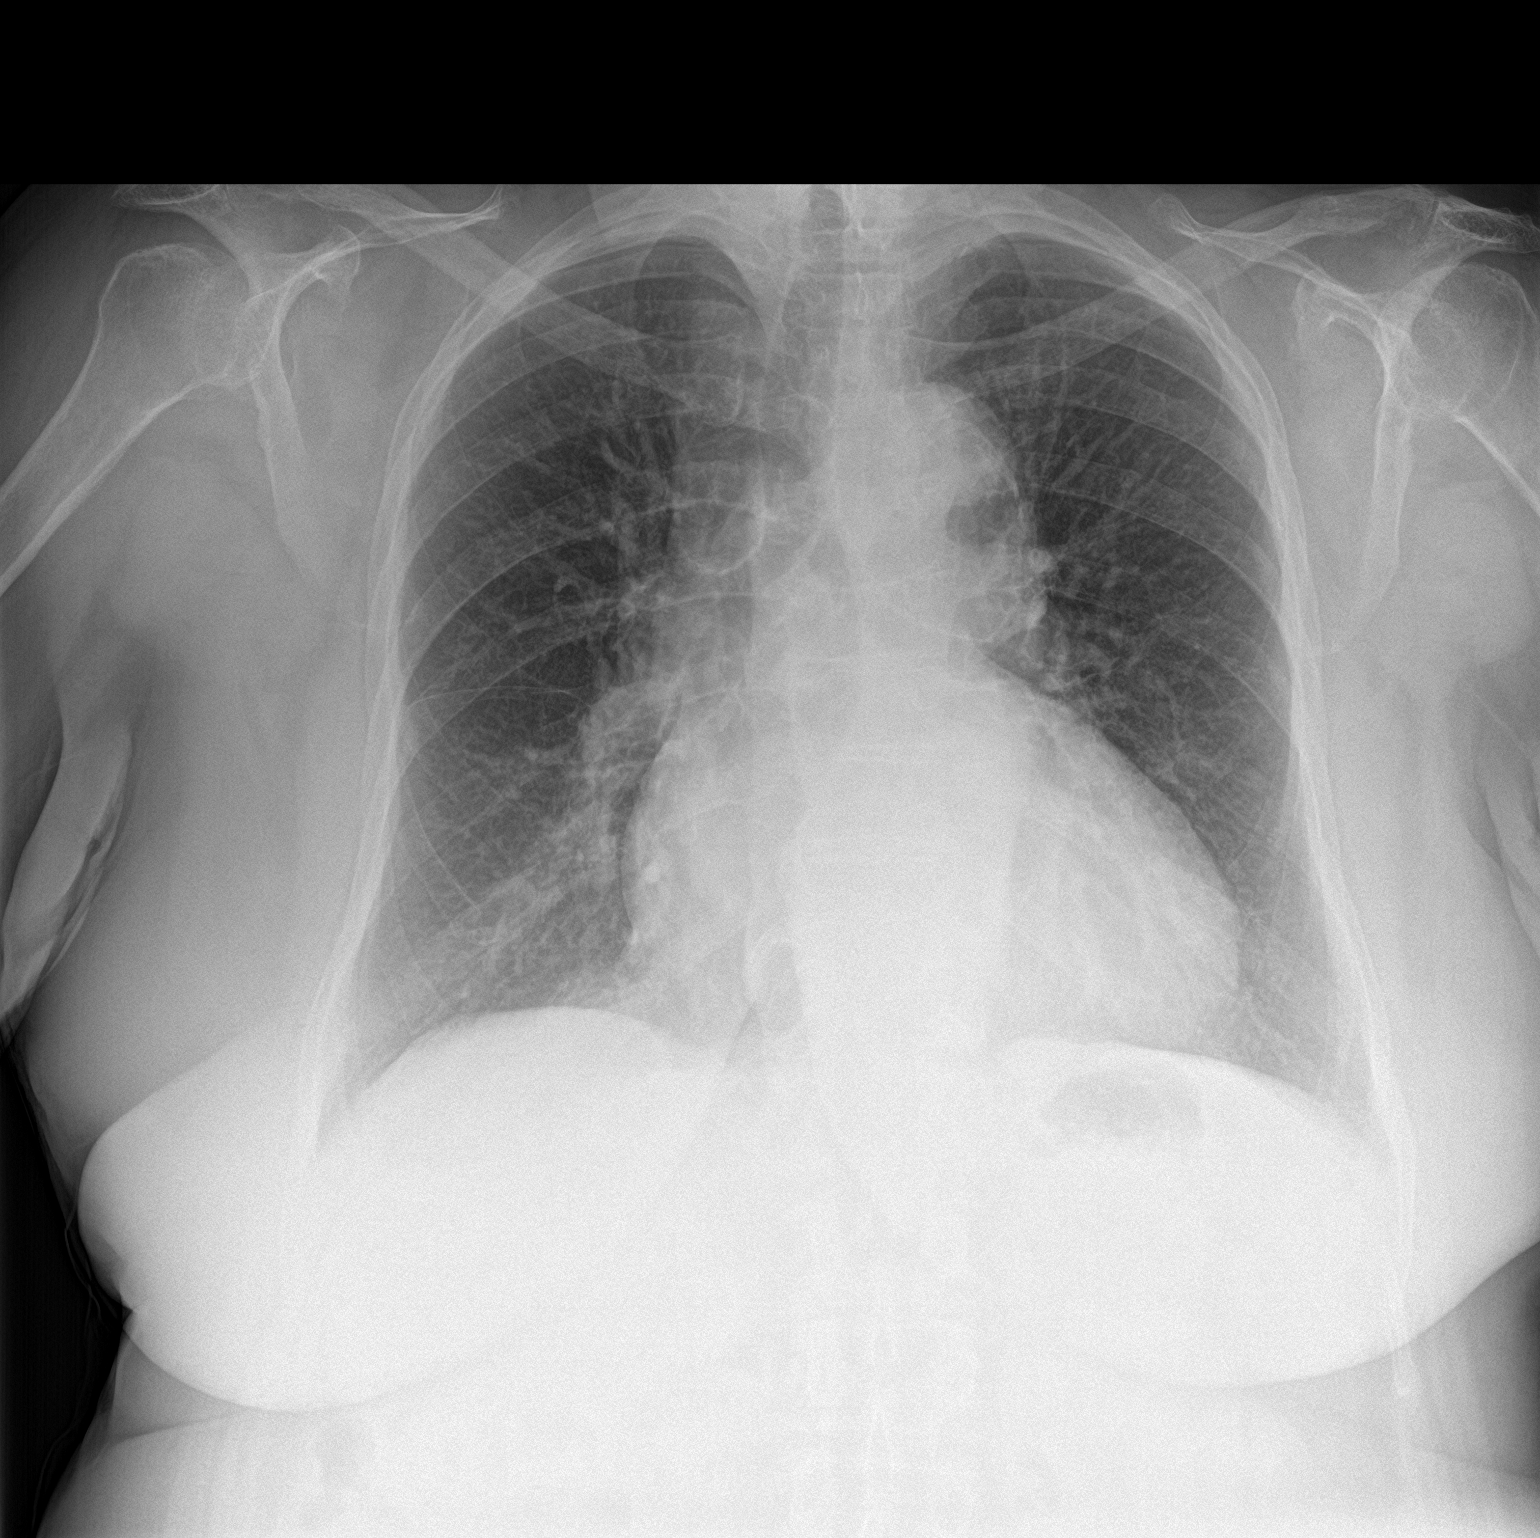

[chest lat]
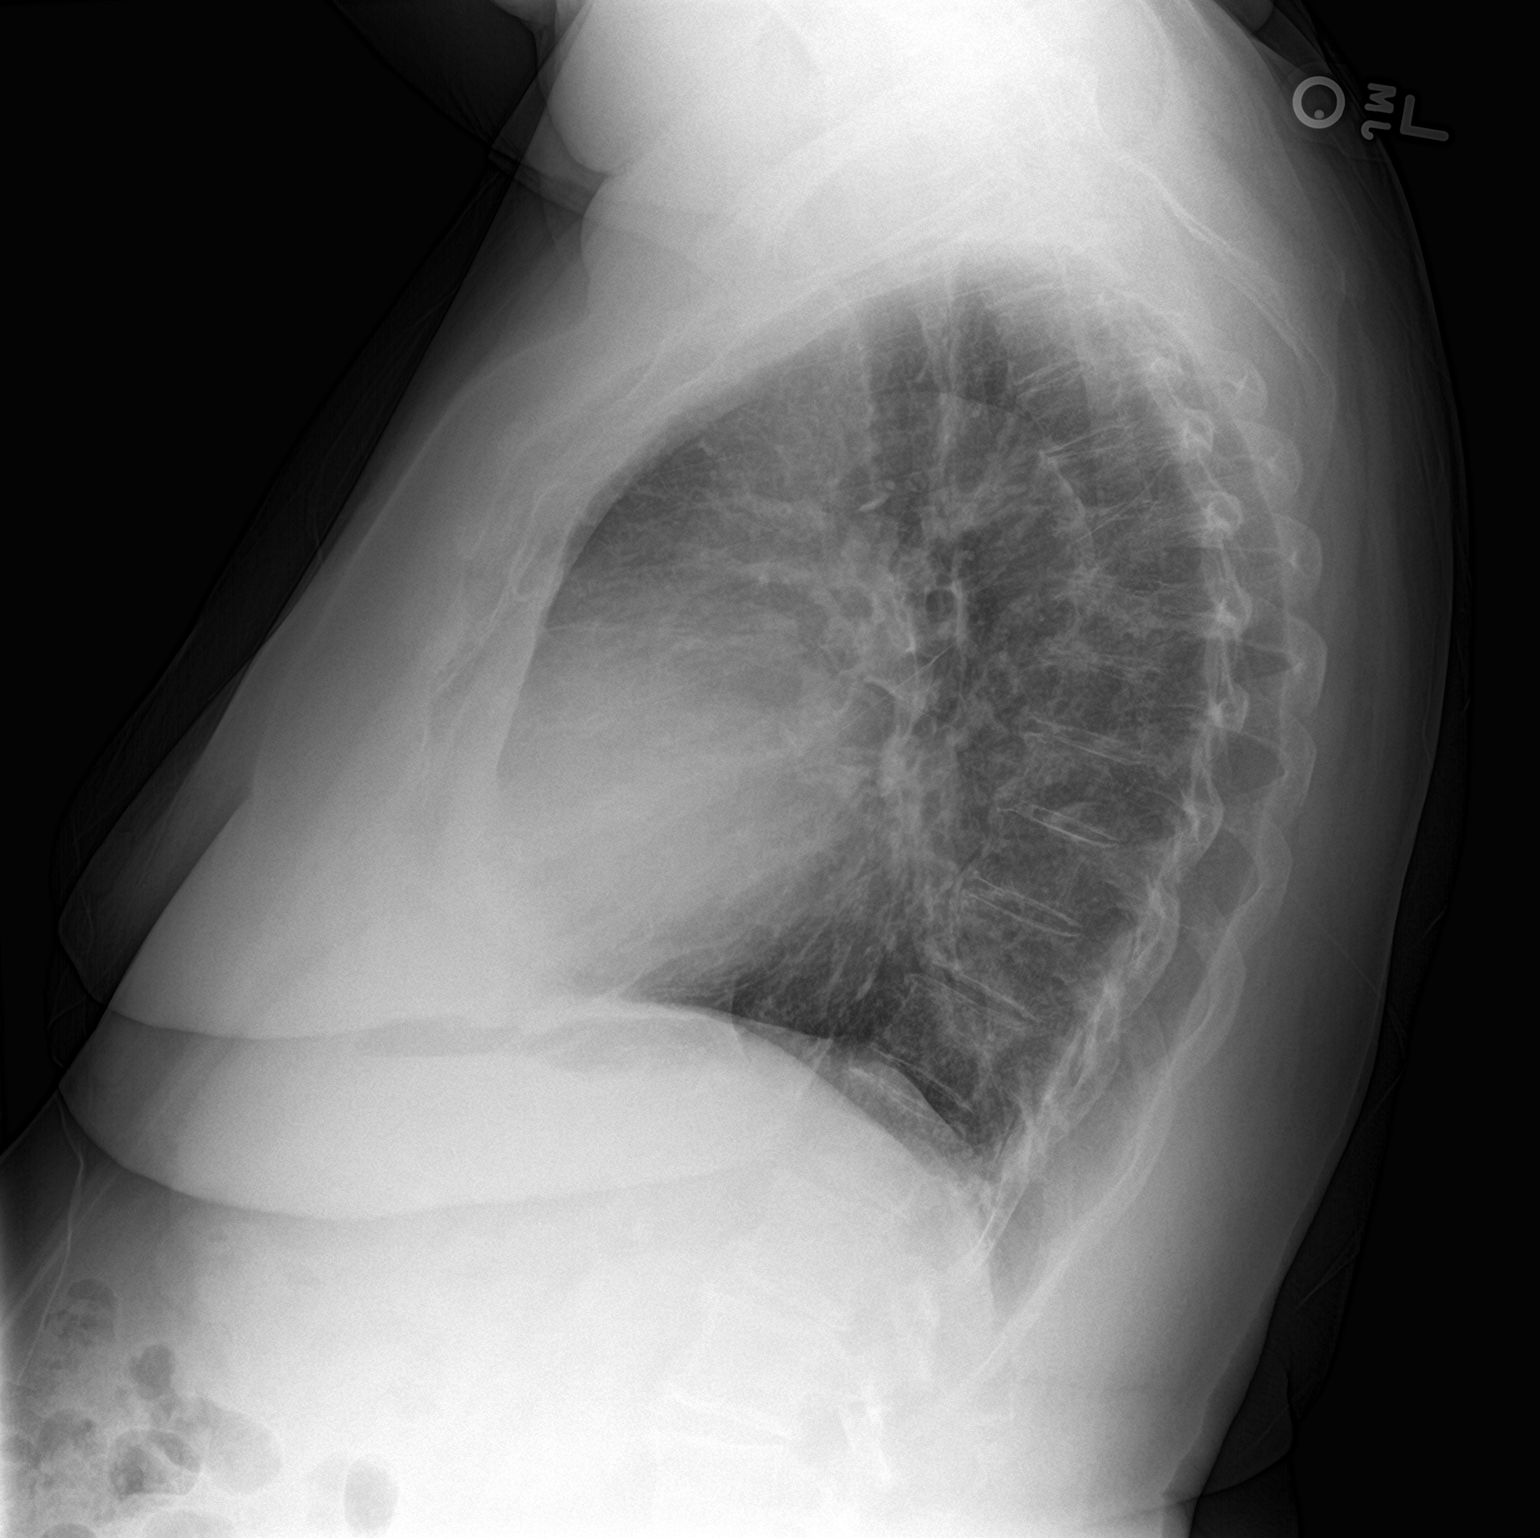

[2 of 2 positions shown; findings below may reference images not displayed]

FINDINGS: Lungs are clear. Heart is slightly enlarged with pulmonary
vascularity normal. No adenopathy. There is aortic atherosclerosis.
There is degenerative change in the thoracic spine.
IMPRESSION: Mild cardiac enlargement. No edema or consolidation. Aortic
Atherosclerosis ([id number]-FNL.L).

## 2020-08-28 ENCOUNTER — Other Ambulatory Visit: Payer: Self-pay | Admitting: Cardiovascular Disease

## 2020-09-21 IMAGING — CT CT CHEST HIGH RESOLUTION W/O CM
2 of 5 series · 15 of 36 positions shown, 18 images · non-contrast
Comparison: 03/16/2019.

CLINICAL DATA: High risk medication use, evaluate for interstitial
lung disease.

EXAM:
CT CHEST WITHOUT CONTRAST
TECHNIQUE: Multidetector CT imaging of the chest was performed following the
standard protocol without intravenous contrast. High resolution
imaging of the lungs, as well as inspiratory and expiratory imaging,
was performed.

[Series 2: high resolution · axial · 0.73mm/px · z∈[-291,-29]mm · 12 of 145 slices shown, 15 images]
[im 7/145  mediastinal]
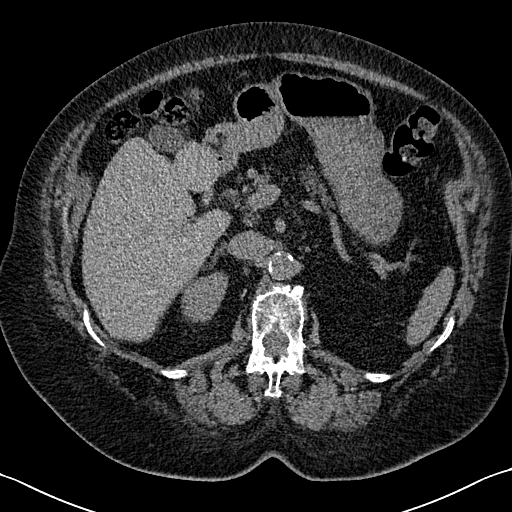
[im 7/145  lung]
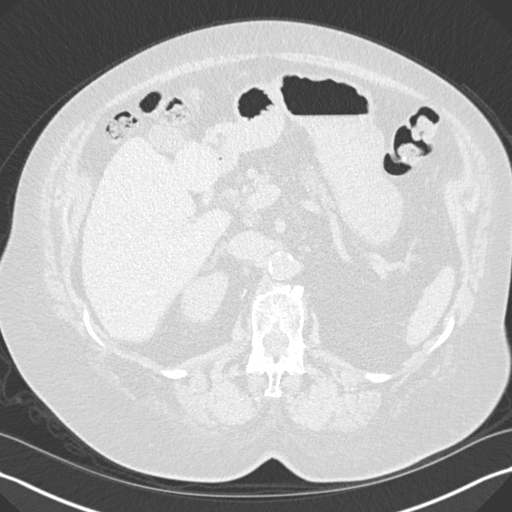
[im 20/145  lung]
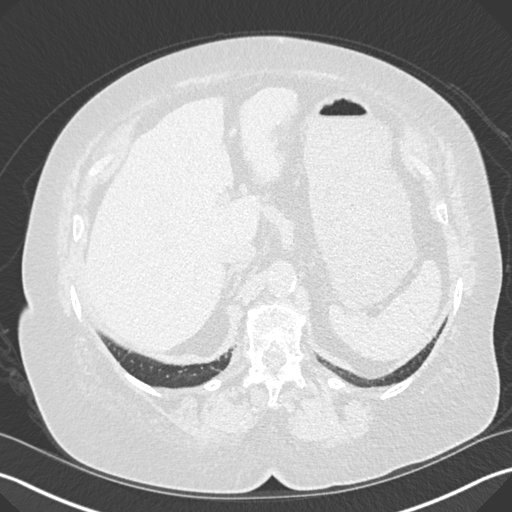
[im 33/145  lung]
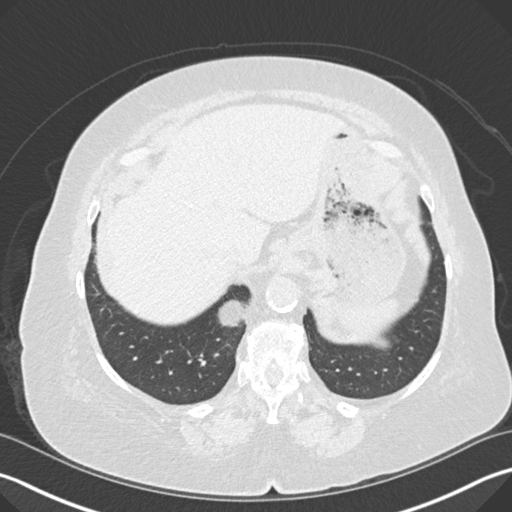
[im 46/145  lung]
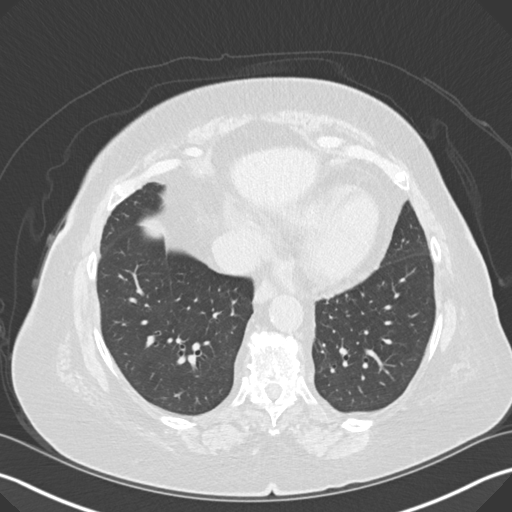
[im 53/145  mediastinal]
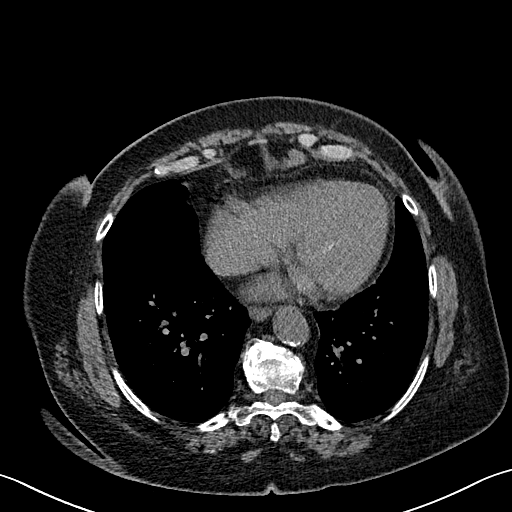
[im 53/145  lung]
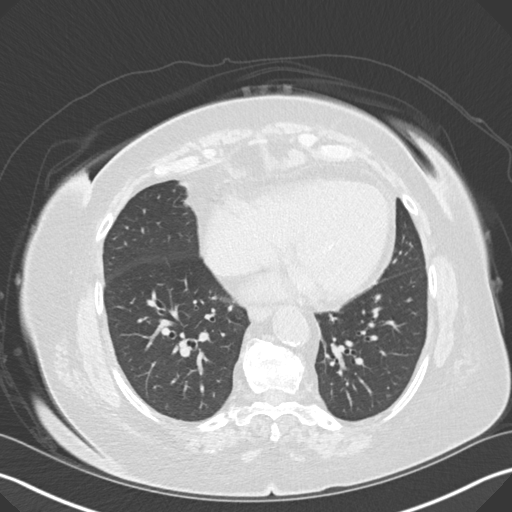
[im 66/145  lung]
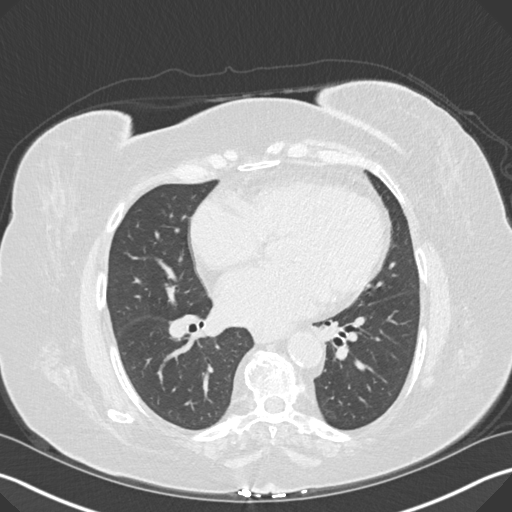
[im 79/145  lung]
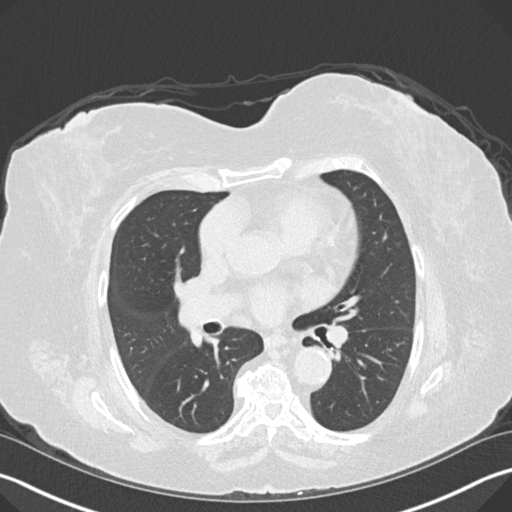
[im 92/145  lung]
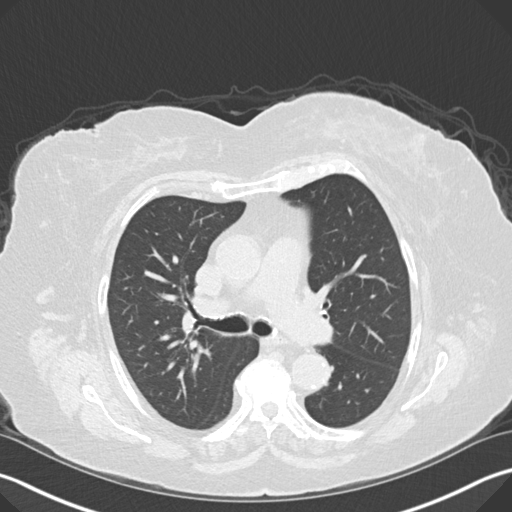
[im 99/145  mediastinal]
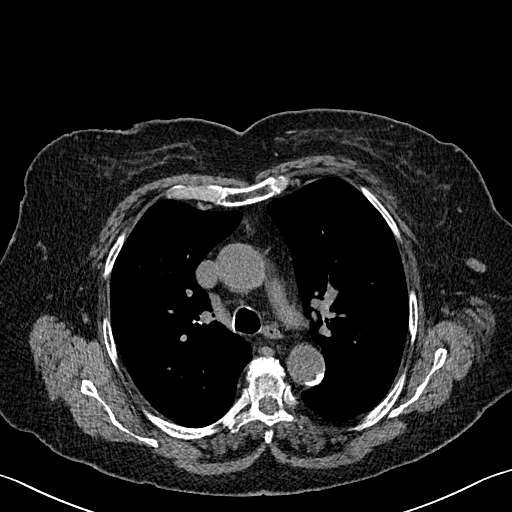
[im 99/145  lung]
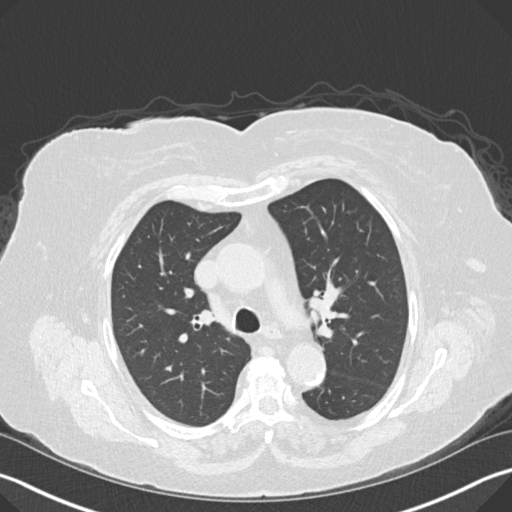
[im 112/145  lung]
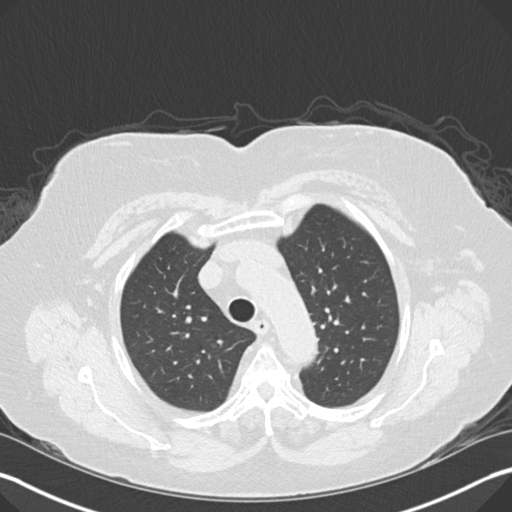
[im 125/145  lung]
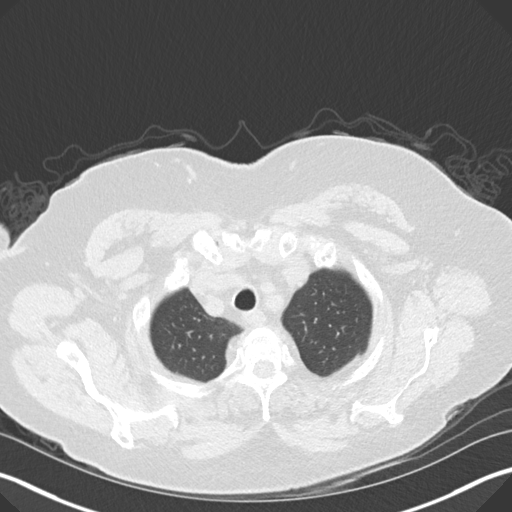
[im 138/145  lung]
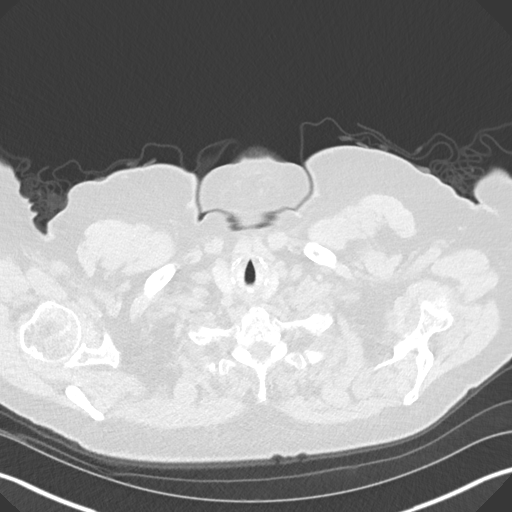

[Series 7: coronal · coronal · 0.59mm/px · 3 of 137 slices shown]
[im 28/137  lung]
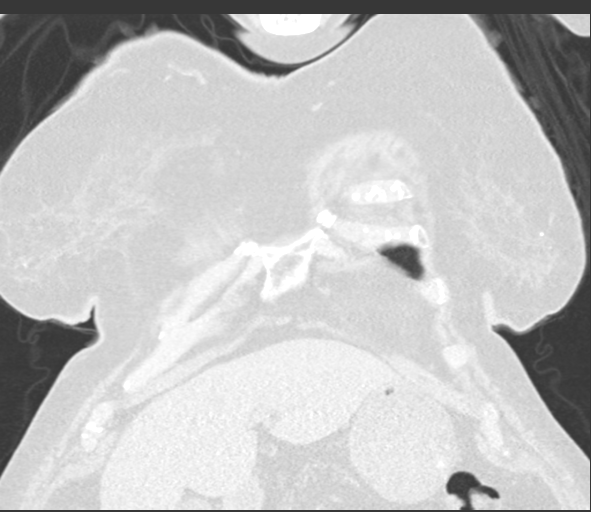
[im 55/137  lung]
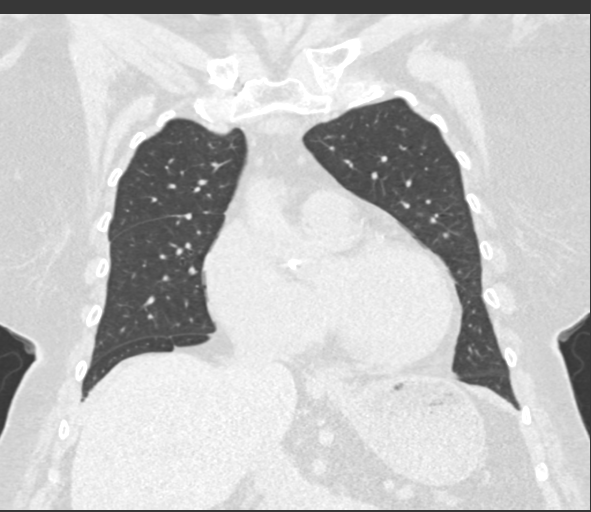
[im 82/137  lung]
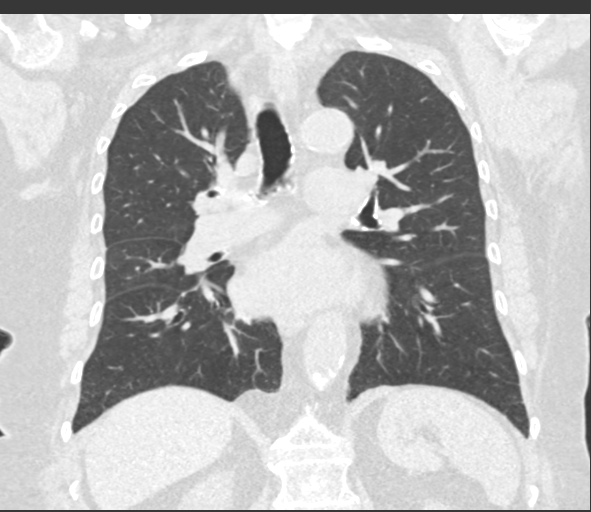

[15 of 36 positions shown; findings below may reference images not displayed]

FINDINGS: Cardiovascular: Atherosclerotic calcification of the aorta and
coronary arteries. Heart is enlarged. No pericardial effusion.

Mediastinum/Nodes: No pathologically enlarged mediastinal or
axillary lymph nodes. Hilar regions are difficult to evaluate
without IV contrast. Esophagus is unremarkable.

Lungs/Pleura: Negative for subpleural reticulation, traction
bronchiectasis/bronchiolectasis, ground-glass, architectural
distortion or honeycombing. Lungs are clear. No pleural fluid.
Airway is unremarkable. Mild air trapping.

Upper Abdomen: Liver margin is irregular. Nodular lesion along the
posterior right hepatic lobe (2/136), unchanged. Visualized portions
of the liver, gallbladder, adrenal glands, kidneys, spleen,
pancreas, stomach and bowel are otherwise grossly unremarkable. No
upper abdominal adenopathy.

Musculoskeletal: Degenerative changes in the spine. No worrisome
lytic or sclerotic lesions.
IMPRESSION: 1. No evidence of interstitial lung disease.
2. Air trapping is indicative of small airways disease.
3. Cirrhosis.
4. Aortic atherosclerosis ([id number]). Coronary artery
calcification.

## 2021-03-30 IMAGING — US VENOUS DOPPLER ULTRASOUND OF LEFT LOWER EXTREMITY
1 series · 13 of 24 positions shown · non-contrast
Comparison: None.

CLINICAL DATA: 89-year-old female with nodule behind the knee



[Series 1: venous doppler ultrasound of left lower extremity · 0.10mm/px · 13 of 30 slices shown]
[im 1/30]
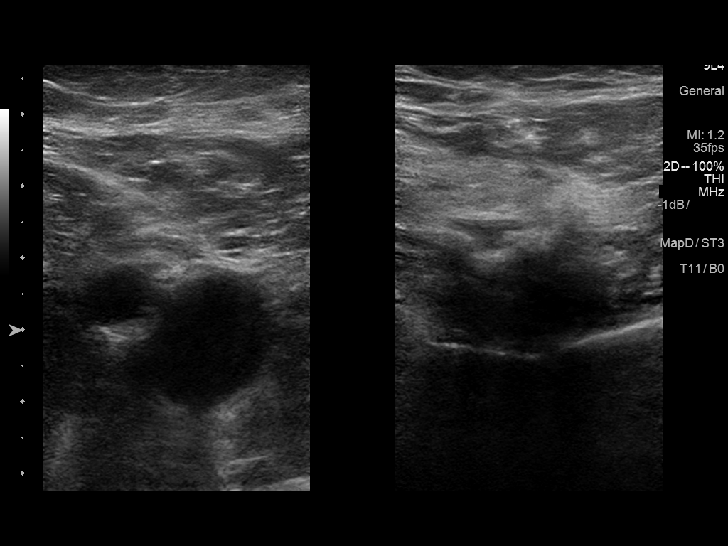
[im 3/30]
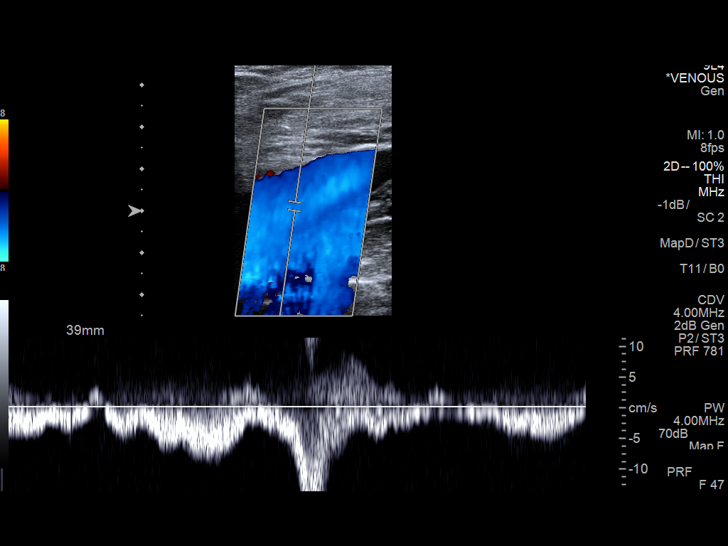
[im 6/30]
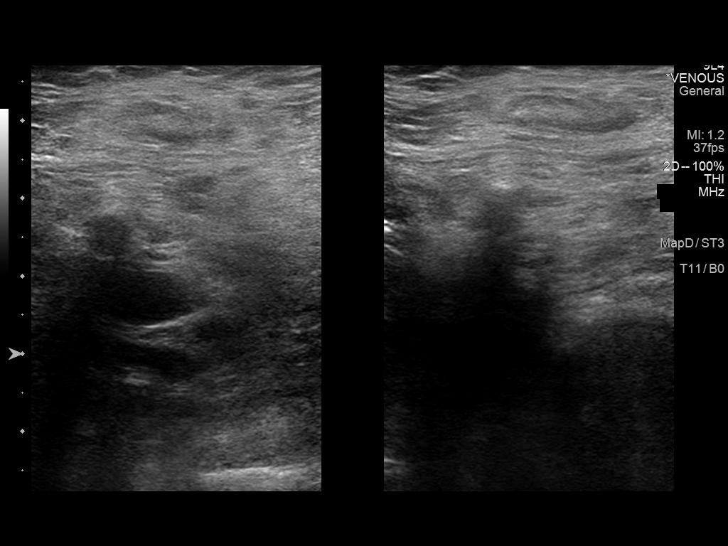
[im 8/30]
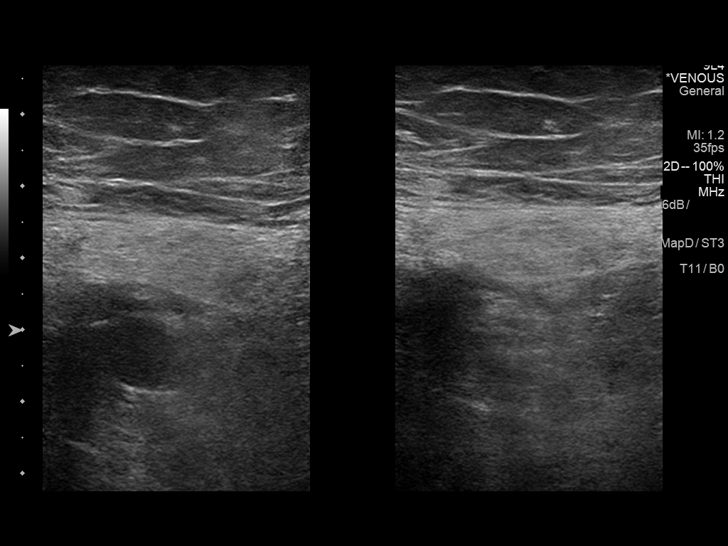
[im 11/30]
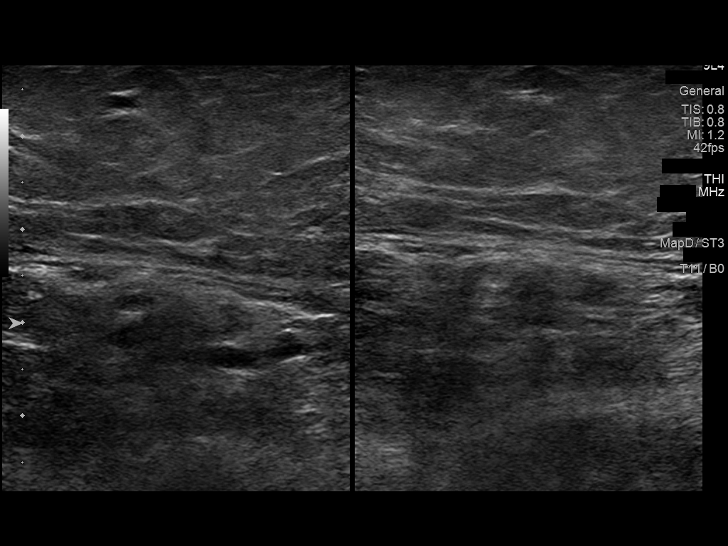
[im 13/30]
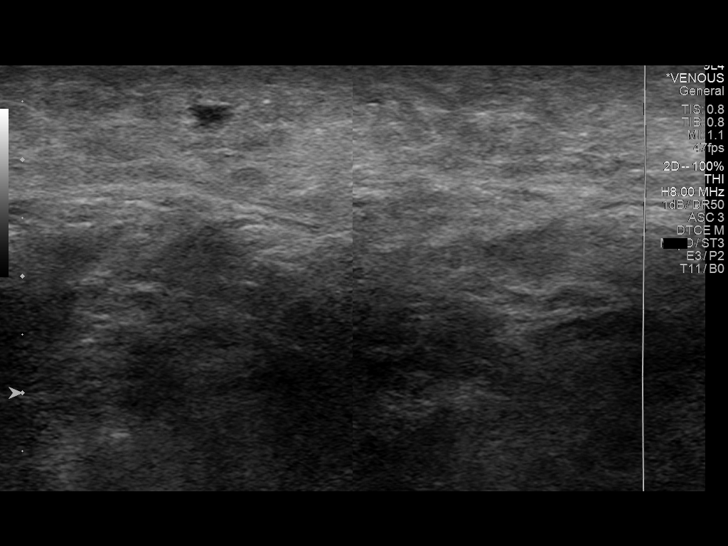
[im 16/30]
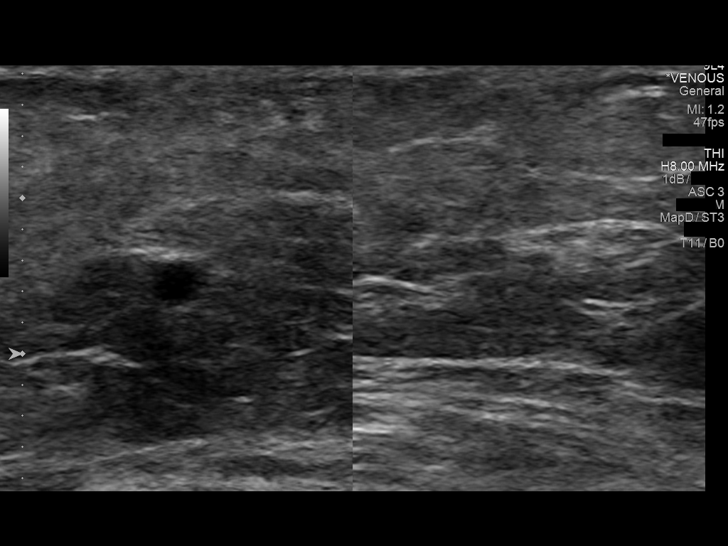
[im 17/30]
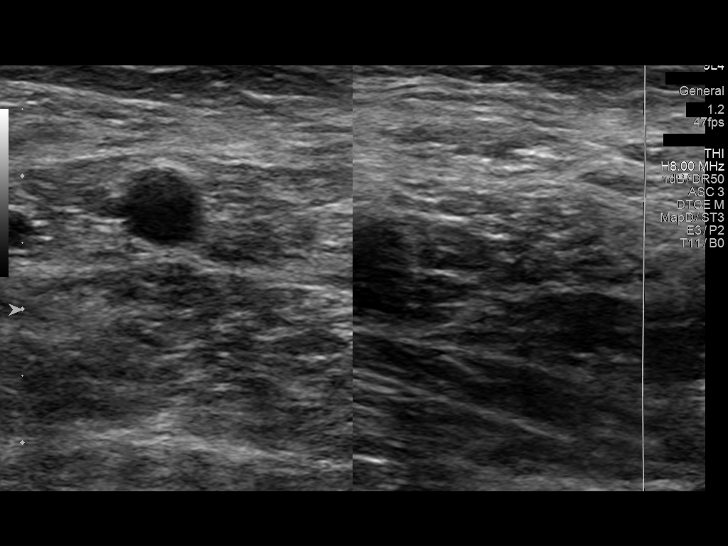
[im 19/30]
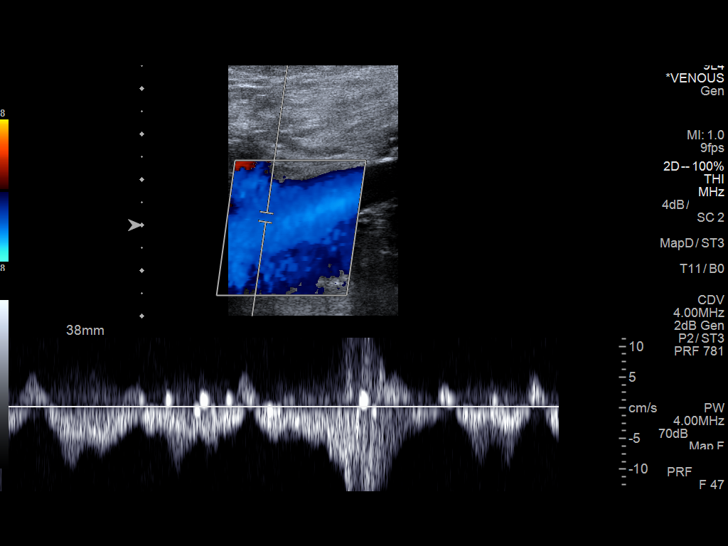
[im 22/30]
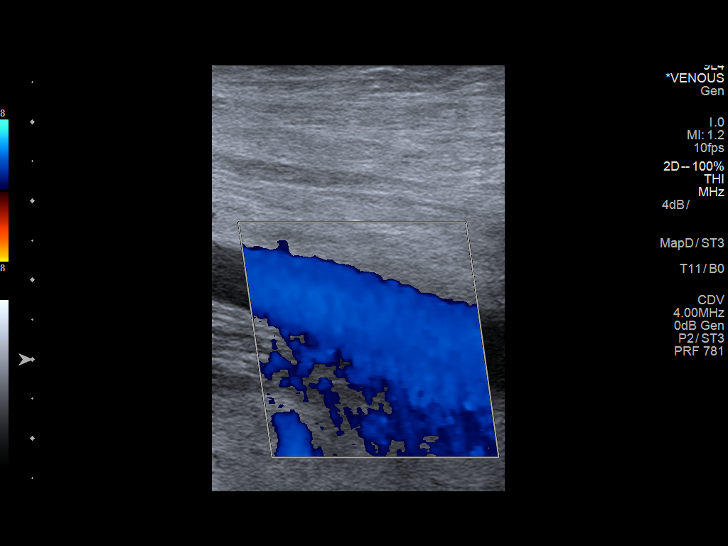
[im 24/30]
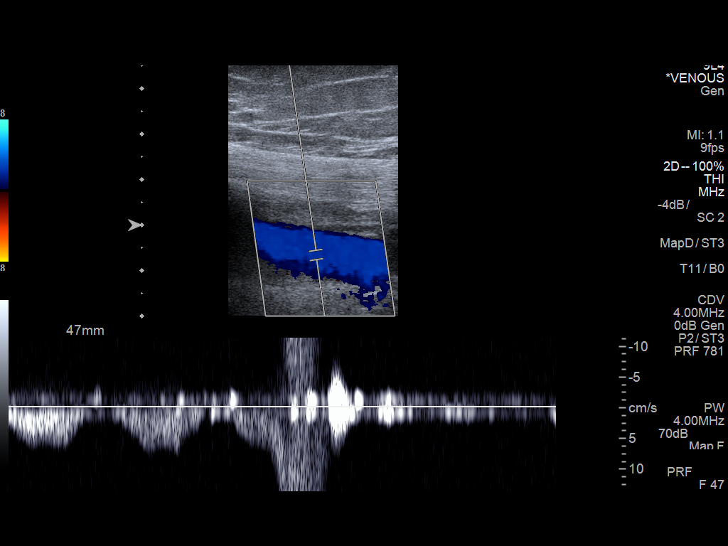
[im 27/30]
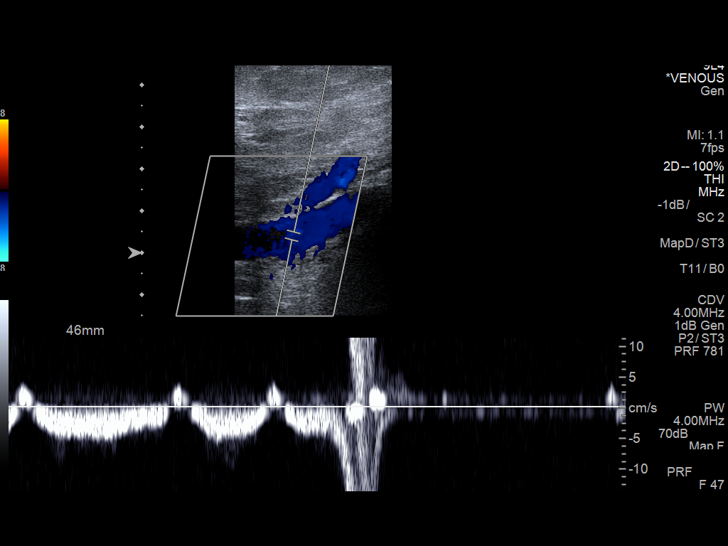
[im 30/30]
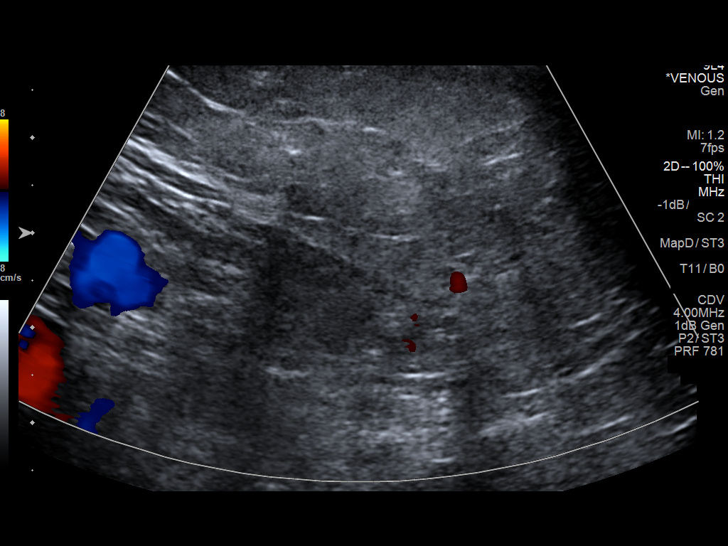

[13 of 24 positions shown; findings below may reference images not displayed]

FINDINGS: Contralateral Common Femoral Vein: Respiratory phasicity is normal
and symmetric with the symptomatic side. No evidence of thrombus.
Normal compressibility.

Common Femoral Vein: No evidence of thrombus. Normal
compressibility, respiratory phasicity and response to augmentation.

Saphenofemoral Junction: No evidence of thrombus. Normal
compressibility and flow on color Doppler imaging.

Profunda Femoral Vein: No evidence of thrombus. Normal
compressibility and flow on color Doppler imaging.

Femoral Vein: No evidence of thrombus. Normal compressibility,
respiratory phasicity and response to augmentation.

Popliteal Vein: No evidence of thrombus. Normal compressibility,
respiratory phasicity and response to augmentation.

Calf Veins: No evidence of thrombus. Normal compressibility and flow
on color Doppler imaging.

Superficial Great Saphenous Vein: No evidence of thrombus. Normal
compressibility and flow on color Doppler imaging.

Other Findings:  None.
IMPRESSION: Sonographic survey of the left lower extremity negative for DVT
